# Patient Record
Sex: Male | Born: 1965 | Race: White | Hispanic: No | Marital: Single | Smoking: Never smoker
Health system: Southern US, Community
[De-identification: ages and names within clinical notes are randomized; demographics above are authoritative.]

## PROBLEM LIST (undated history)

## (undated) DIAGNOSIS — I10 Essential (primary) hypertension: Secondary | ICD-10-CM

## (undated) HISTORY — PX: WRIST SURGERY: SHX841

## (undated) HISTORY — PX: LEG SURGERY: SHX1003

---

## 2002-05-16 ENCOUNTER — Emergency Department (HOSPITAL_COMMUNITY): Admission: AD | Admit: 2002-05-16 | Discharge: 2002-05-17 | Payer: Self-pay

## 2013-01-31 ENCOUNTER — Emergency Department (INDEPENDENT_AMBULATORY_CARE_PROVIDER_SITE_OTHER)
Admission: EM | Admit: 2013-01-31 | Discharge: 2013-01-31 | Disposition: A | Discharge status: Home / Self Care | Payer: Managed Care, Other (non HMO) | Source: Home / Self Care | Attending: Family Medicine | Admitting: Family Medicine

## 2013-01-31 ENCOUNTER — Encounter (HOSPITAL_COMMUNITY): Payer: Self-pay | Admitting: Emergency Medicine

## 2013-01-31 DIAGNOSIS — L738 Other specified follicular disorders: Secondary | ICD-10-CM

## 2013-01-31 DIAGNOSIS — L739 Follicular disorder, unspecified: Secondary | ICD-10-CM

## 2013-01-31 DIAGNOSIS — B86 Scabies: Secondary | ICD-10-CM

## 2013-01-31 MED ORDER — DOXYCYCLINE HYCLATE 100 MG PO CAPS: 100.0000 mg | ORAL_CAPSULE | Freq: Two times a day (BID) | ORAL | Status: DC

## 2013-01-31 MED ORDER — PERMETHRIN 5 % EX CREA: TOPICAL_CREAM | CUTANEOUS | Status: DC

## 2013-01-31 NOTE — ED Provider Notes (Signed)
Clifford Mejia is a 48 y.o. male who presents to Urgent Care today for erythematous pruritic papules on the bilateral forearms. This is been present for several weeks and is very itchy at bedtime. He has tried hydrocortisone and Benadryl which have not helped. The rash has spread to his lower extremities and feet. Additionally he notes a 3 cm in diameter oval on his right back that has been present for several days and is mildly tender. No fevers or chills nausea vomiting or diarrhea. No other members of his household with similar rash. He notes that he recently helped a friend move and noted that the apartment was very dirty.    History reviewed. No pertinent past medical history. History  Substance Use Topics  . Smoking status: Never Smoker   . Smokeless tobacco: Not on file  . Alcohol Use: No   ROS as above Medications reviewed. No current facility-administered medications for this encounter.   Current Outpatient Prescriptions  Medication Sig Dispense Refill  . doxycycline (VIBRAMYCIN) 100 MG capsule Take 1 capsule (100 mg total) by mouth 2 (two) times daily.  20 capsule  0  . permethrin (ELIMITE) 5 % cream Apply from the neck down at night and wash off in the morning once  60 g  1    Exam:  BP 144/86  Pulse 67  Temp(Src) 98.7 F (37.1 C) (Oral)  Resp 16  SpO2 100% Gen: Well NAD SKIN:  3 cm diameter oval erythematous indurated tender patch with multiple excoriated papules right back of skin overlying the scapula.  Multiple small erythematous papules most excoriated on bilateral upper extremities.  No mucocutaneous involvement.   Assessment and Plan: 48 y.o. male with scabies.  Plan to treat with permethrin cream as well as house hygiene. Will treat other members of his household as well. Additionally suspect patient has developed folliculitis on his back secondary to  bacterial superinfection of excoriated papules. Treat folliculitis with doxycycline.  Discussed warning  signs or symptoms. Please see discharge instructions. Patient expresses understanding.    Gregor Hams, MD 01/31/13 [phone]

## 2013-01-31 NOTE — ED Notes (Addendum)
C/o rash on back x 2 weeks with itching.  He tried Hydrocortisone and Benadryl cream with partial relief.  Spread to arms, legs, feet. The ones on his back are in a 2-3" oval clump below R clavicle and looked scabbed over, like they have been filled with fluid.  The others are just single small red raised areas. The cream helps for a few hours.

## 2013-01-31 NOTE — Discharge Instructions (Signed)
Thank you for coming in today. Apply the permethrin cream from the neck down at night and wash off in the morning. Use Benadryl at night as needed for itching. Use [place] Itch as needed. Come back if not getting better or worsening.  Take doxycycline twice daily for 8-38 days  Folliculitis  Folliculitis is redness, soreness, and swelling (inflammation) of the hair follicles. This condition can occur anywhere on the body. People with weakened immune systems, diabetes, or obesity have a greater risk of getting folliculitis. CAUSES  Bacterial infection. This is the most common cause.  Fungal infection.  Viral infection.  Contact with certain chemicals, especially oils and tars. Long-term folliculitis can result from bacteria that live in the nostrils. The bacteria may trigger multiple outbreaks of folliculitis over time. SYMPTOMS Folliculitis most commonly occurs on the scalp, thighs, legs, back, buttocks, and areas where hair is shaved frequently. An early sign of folliculitis is a small, white or yellow, pus-filled, itchy lesion (pustule). These lesions appear on a red, inflamed follicle. They are usually less than 0.2 inches (5 mm) wide. When there is an infection of the follicle that goes deeper, it becomes a boil or furuncle. A group of closely packed boils creates a larger lesion (carbuncle). Carbuncles tend to occur in hairy, sweaty areas of the body. DIAGNOSIS  Your caregiver can usually tell what is wrong by doing a physical exam. A sample may be taken from one of the lesions and tested in a lab. This can help determine what is causing your folliculitis. TREATMENT  Treatment may include:  Applying warm compresses to the affected areas.  Taking antibiotic medicines orally or applying them to the skin.  Draining the lesions if they contain a large amount of pus or fluid.  Laser hair removal for cases of long-lasting folliculitis. This helps to prevent regrowth of the  hair. HOME CARE INSTRUCTIONS  Apply warm compresses to the affected areas as directed by your caregiver.  If antibiotics are prescribed, take them as directed. Finish them even if you start to feel better.  You may take over-the-counter medicines to relieve itching.  Do not shave irritated skin.  Follow up with your caregiver as directed. SEEK IMMEDIATE MEDICAL CARE IF:   You have increasing redness, swelling, or pain in the affected area.  You have a fever. MAKE SURE YOU:  Understand these instructions.  Will watch your condition.  Will get help right away if you are not doing well or get worse. Document Released: 03/16/2001 Document Revised: 07/07/2011 Document Reviewed: 04/07/2011 [facility] Patient Information 2014 [place], [place].

## 2013-05-13 ENCOUNTER — Emergency Department (HOSPITAL_COMMUNITY)
Admission: EM | Admit: 2013-05-13 | Discharge: 2013-05-13 | Disposition: A | Discharge status: Home / Self Care | Payer: BC Managed Care – PPO | Source: Home / Self Care | Attending: Family Medicine | Admitting: Family Medicine

## 2013-05-13 ENCOUNTER — Encounter (HOSPITAL_COMMUNITY): Payer: Self-pay | Admitting: Emergency Medicine

## 2013-05-13 DIAGNOSIS — B86 Scabies: Secondary | ICD-10-CM

## 2013-05-13 MED ORDER — HYDROXYZINE HCL 25 MG PO TABS: 25.0000 mg | ORAL_TABLET | Freq: Four times a day (QID) | ORAL | Status: DC | PRN

## 2013-05-13 MED ORDER — PERMETHRIN 5 % EX CREA: TOPICAL_CREAM | CUTANEOUS | Status: DC

## 2013-05-13 NOTE — ED Provider Notes (Signed)
CSN: [id number]     Arrival date & time 05/13/13  0919 History   First MD Initiated Contact with Patient 05/13/13 [phone]     Chief Complaint  Patient presents with  . Rash   (Consider location/radiation/quality/duration/timing/severity/associated sxs/prior Treatment) Patient is a 48 y.o. male presenting with rash. The history is provided by the patient.  Rash Location:  Full body Quality: itchiness   Severity:  Mild Onset quality:  Gradual Duration:  3 months Progression:  Worsening Chronicity:  Recurrent Context: not sick contacts   Context comment:  Seen in dec with similar rash , dx'd as scabies and treatment helped but relapsed. Associated symptoms: no fever     History reviewed. No pertinent past medical history. Past Surgical History  Procedure Laterality Date  . Wrist surgery Left    Family History  Problem Relation Age of Onset  . Hypertension Mother   . Hypertension Father    History  Substance Use Topics  . Smoking status: Never Smoker   . Smokeless tobacco: Not on file  . Alcohol Use: No    Review of Systems  Constitutional: Negative.  Negative for fever.  Gastrointestinal: Negative.   Musculoskeletal: Negative.   Skin: Positive for rash.    Allergies  Review of patient's allergies indicates no known allergies.  Home Medications   Prior to Admission medications   Medication Sig Start Date End Date Taking? Authorizing Provider  doxycycline (VIBRAMYCIN) 100 MG capsule Take 1 capsule (100 mg total) by mouth 2 (two) times daily. 01/31/13   Gregor Hams, MD  permethrin (ELIMITE) 5 % cream Apply from the neck down at night and wash off in the morning once 01/31/13   Gregor Hams, MD   BP 162/82  Pulse 66  Temp(Src) 98.3 F (36.8 C) (Oral)  Resp 17  SpO2 97% Physical Exam  Nursing note and vitals reviewed. Constitutional: He is oriented to person, place, and time. He appears well-developed and well-nourished.  Eyes: Conjunctivae are normal. Pupils are  equal, round, and reactive to light.  Neck: Normal range of motion. Neck supple.  Neurological: He is alert and oriented to person, place, and time.  Skin: Skin is warm and dry. Rash noted.  Nondescript itchy rash on body.    ED Course  Procedures (including critical care time) Labs Review Labs Reviewed - No data to display  Imaging Review No results found.   MDM   1. Scabies        Billy Fischer, MD 05/13/13 1027

## 2013-05-13 NOTE — ED Notes (Signed)
Pt c/o intermittent rash onset 1 month Rash on arms, upper back and legs Rash increases after warm showers Seen here back in 12/14 and dx w/scabies Alert w/no signs of acute distress.

## 2013-07-20 ENCOUNTER — Emergency Department (HOSPITAL_COMMUNITY)
Admission: EM | Admit: 2013-07-20 | Discharge: 2013-07-20 | Disposition: A | Discharge status: Home / Self Care | Payer: BC Managed Care – PPO | Source: Home / Self Care | Attending: Family Medicine | Admitting: Family Medicine

## 2013-07-20 ENCOUNTER — Encounter (HOSPITAL_COMMUNITY): Payer: Self-pay | Admitting: Emergency Medicine

## 2013-07-20 DIAGNOSIS — L299 Pruritus, unspecified: Secondary | ICD-10-CM

## 2013-07-20 NOTE — ED Notes (Signed)
pT  STATES  HE  WAS  TREATED FOR  SCABIES  ABOUT  1  MONTH   AGO     HE  REPORTS  ABOUT 1  WEEK  AGO  HE  DEBELOPED  SOME  ITCHING  -  NO  KNOWN CAUSATIVE  AGENT    NO  ANGIOEDEMA   APPEARING IN NO  ACUTE  DISTRESS

## 2013-07-20 NOTE — ED Provider Notes (Signed)
CSN: [id number]     Arrival date & time 07/20/13  1811 History   First MD Initiated Contact with Patient 07/20/13 1924     Chief Complaint  Patient presents with  . Pruritis   (Consider location/radiation/quality/duration/timing/severity/associated sxs/prior Treatment) Patient is a 48 y.o. male presenting with rash. The history is provided by the patient. No language interpreter was used.  Rash Quality: redness   Severity:  Mild Onset quality:  Gradual Timing:  Constant Progression:  Worsening Chronicity:  New Relieved by:  Nothing Worsened by:  Nothing tried Ineffective treatments:  None tried pt worried about scabies.  Pt had a month ago.  Pt has itching to his back Hands and arms not involved History reviewed. No pertinent past medical history. Past Surgical History  Procedure Laterality Date  . Wrist surgery Left    Family History  Problem Relation Age of Onset  . Hypertension Mother   . Hypertension Father    History  Substance Use Topics  . Smoking status: Never Smoker   . Smokeless tobacco: Not on file  . Alcohol Use: No    Review of Systems  Skin: Positive for rash.  All other systems reviewed and are negative.   Allergies  Review of patient's allergies indicates no known allergies.  Home Medications   Prior to Admission medications   Medication Sig Start Date End Date Taking? Authorizing Provider  doxycycline (VIBRAMYCIN) 100 MG capsule Take 1 capsule (100 mg total) by mouth 2 (two) times daily. 01/31/13   Gregor Hams, MD  hydrOXYzine (ATARAX/VISTARIL) 25 MG tablet Take 1 tablet (25 mg total) by mouth every 6 (six) hours as needed for itching. 05/13/13   Billy Fischer, MD  permethrin (ELIMITE) 5 % cream Apply from the neck down at night and wash off in the morning once 01/31/13   Gregor Hams, MD  permethrin (ELIMITE) 5 % cream Use as instructed on package 05/13/13   Billy Fischer, MD   BP 149/94  Pulse 69  Temp(Src) 97.5 F (36.4 C) (Oral)  Resp 18   SpO2 96% Physical Exam  Nursing note and vitals reviewed. Constitutional: He is oriented to person, place, and time. He appears well-developed and well-nourished.  HENT:  Head: Normocephalic.  Eyes: EOM are normal.  Neck: Normal range of motion.  Pulmonary/Chest: Effort normal.  Musculoskeletal: Normal range of motion.  Neurological: He is alert and oriented to person, place, and time.  Skin: No rash noted.  Psychiatric: He has a normal mood and affect.    ED Course  Procedures (including critical care time) Labs Review Labs Reviewed - No data to display  Imaging Review No results found.   MDM   1. Pruritus    Pt advised hydrocortisone, benadryl to ic=tchy areas,  No sign of scabies    Fransico Meadow, [place] 07/20/13 1943

## 2013-07-20 NOTE — Discharge Instructions (Signed)
Pruritus  Pruritis is an itch. There are many different problems that can cause an itch. Dry skin is one of the most common causes of itching. Most cases of itching do not require medical attention.  HOME CARE INSTRUCTIONS  Make sure your skin is moistened on a regular basis. A moisturizer that contains petroleum jelly is best for keeping moisture in your skin. If you develop a rash, you may try the following for relief:   Use corticosteroid cream.  Apply cool compresses to the affected areas.  Bathe with Epsom salts or baking soda in the bathwater.  Soak in colloidal oatmeal baths. These are available at your pharmacy.  Apply baking soda paste to the rash. Stir water into baking soda until it reaches a paste-like consistency.  Use an anti-itch lotion.  Take over-the-counter diphenhydramine medicine by mouth as the instructions direct.  Avoid scratching. Scratching may cause the rash to become infected. If itching is very bad, your caregiver may suggest prescription lotions or creams to lessen your symptoms.  Avoid hot showers, which can make itching worse. A cold shower may help with itching as long as you use a moisturizer after the shower. SEEK MEDICAL CARE IF: The itching does not go away after several days. Document Released: 09/17/2010 Document Revised: 03/30/2011 Document Reviewed: 09/17/2010 [facility] Patient Information 2015 [place], [place]. This information is not intended to replace advice given to you by your health care provider. Make sure you discuss any questions you have with your health care provider. Benadryl for itching.  Use a good moisturizer,  Hydrocortisone ointment to areas of itching

## 2013-07-21 NOTE — ED Provider Notes (Signed)
Medical screening examination/treatment/procedure(s) were performed by a resident physician or non-physician practitioner and as the supervising physician I was immediately available for consultation/collaboration.  Lynne Leader, MD    Gregor Hams, MD 07/21/13 [phone]

## 2015-06-27 ENCOUNTER — Ambulatory Visit (HOSPITAL_COMMUNITY)
Admission: EM | Admit: 2015-06-27 | Discharge: 2015-06-27 | Disposition: A | Discharge status: Home / Self Care | Payer: BLUE CROSS/BLUE SHIELD | Attending: Emergency Medicine | Admitting: Emergency Medicine

## 2015-06-27 ENCOUNTER — Encounter (HOSPITAL_COMMUNITY): Payer: Self-pay | Admitting: Emergency Medicine

## 2015-06-27 DIAGNOSIS — L209 Atopic dermatitis, unspecified: Secondary | ICD-10-CM | POA: Diagnosis not present

## 2015-06-27 DIAGNOSIS — Z8249 Family history of ischemic heart disease and other diseases of the circulatory system: Secondary | ICD-10-CM | POA: Diagnosis not present

## 2015-06-27 DIAGNOSIS — Z202 Contact with and (suspected) exposure to infections with a predominantly sexual mode of transmission: Secondary | ICD-10-CM

## 2015-06-27 MED ORDER — HYDROCORTISONE 1 % EX CREA: TOPICAL_CREAM | CUTANEOUS | Status: DC

## 2015-06-27 MED ORDER — METRONIDAZOLE 500 MG PO TABS: 500.0000 mg | ORAL_TABLET | Freq: Two times a day (BID) | ORAL | Status: DC

## 2015-06-27 NOTE — ED Notes (Signed)
Patient cannot void at this time. Given water to drink.

## 2015-06-27 NOTE — ED Notes (Signed)
Patient triaged by provider. Please see note.

## 2015-06-27 NOTE — ED Provider Notes (Signed)
CSN: [id number]     Arrival date & time 06/27/15  1756 History   First MD Initiated Contact with Patient 06/27/15 1813     Chief Complaint  Patient presents with  . Exposure to STD  . Foot Pain   (Consider location/radiation/quality/duration/timing/severity/associated sxs/prior Treatment) HPI History obtained from patient:  Pt presents with the cc of:  2 complaints complaint #1 rash on his right foot complaint #2 STD exposure Duration of symptoms: Rash symptoms have been present for several months STD exposure over the last few days Treatment prior to arrival: Rash on the foot he has used antifungal cream. Context: Foot rash he states he has noted for several months now and is not getting better with any of the treatments that he is doing at home. Other symptoms include: STD exposure states that his girlfriend said that she was seen by her PCP the other day and was diagnosed with Trichomonas. She advised him that he needed to be seen and treated. Pain score: 0 FAMILY HISTORY: Hypertension in mother and father    History reviewed. No pertinent past medical history. Past Surgical History  Procedure Laterality Date  . Wrist surgery Left    Family History  Problem Relation Age of Onset  . Hypertension Mother   . Hypertension Father    Social History  Substance Use Topics  . Smoking status: Never Smoker   . Smokeless tobacco: None  . Alcohol Use: No    Review of Systems  Denies: HEADACHE, NAUSEA, ABDOMINAL PAIN, CHEST PAIN, CONGESTION, DYSURIA, SHORTNESS OF BREATH  Allergies  Review of patient's allergies indicates no known allergies.  Home Medications   Prior to Admission medications   Medication Sig Start Date End Date Taking? Authorizing Provider  doxycycline (VIBRAMYCIN) 100 MG capsule Take 1 capsule (100 mg total) by mouth 2 (two) times daily. 01/31/13   Gregor Hams, MD  hydrocortisone cream 1 % Apply to affected area 2 times daily 06/27/15   Konrad Felix, PA   hydrOXYzine (ATARAX/VISTARIL) 25 MG tablet Take 1 tablet (25 mg total) by mouth every 6 (six) hours as needed for itching. 05/13/13   Billy Fischer, MD  metroNIDAZOLE (FLAGYL) 500 MG tablet Take 1 tablet (500 mg total) by mouth 2 (two) times daily. 06/27/15   Konrad Felix, PA  permethrin (ELIMITE) 5 % cream Apply from the neck down at night and wash off in the morning once 01/31/13   Gregor Hams, MD  permethrin (ELIMITE) 5 % cream Use as instructed on package 05/13/13   Billy Fischer, MD   Meds Ordered and Administered this Visit  Medications - No data to display  BP 152/93 mmHg  Pulse 73  Temp(Src) 98.9 F (37.2 C) (Oral)  Resp 16  SpO2 97% No data found.   Physical Exam NURSES NOTES AND VITAL SIGNS REVIEWED. CONSTITUTIONAL: Well developed, well nourished, no acute distress HEENT: normocephalic, atraumatic EYES: Conjunctiva normal NECK:normal ROM, supple, no adenopathy PULMONARY:No respiratory distress, normal effort ABDOMINAL: Soft, ND, NT BS+, No CVAT MUSCULOSKELETAL: Normal ROM of all extremities, right foot. 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 Similar but smaller area on the left foot. Right foot measures approximately 3 cm dry scaly rash. SKIN: warm and dry without rash PSYCHIATRIC: Mood and affect, behavior are normal  ED Course  Procedures (including critical care time)  Labs Review Labs Reviewed  URINE CYTOLOGY ANCILLARY ONLY    Imaging Review No results found.   Visual  Acuity Review  Right Eye Distance:   Left Eye Distance:   Bilateral Distance:    Right Eye Near:   Left Eye Near:    Bilateral Near:     Cortisone cream for atopic dermatitis and Flagyl for Trichomonas cultures are pending at this time.  STD exposure will be full recovery he topics dermatitis will most likely be an ongoing situation that will have waxing and waning but will not leave any long-term disability sequelae.  MDM   1. Atopic dermatitis    2. STD exposure     Patient is reassured that there are no issues that require transfer to higher level of care at this time or additional tests. Patient is advised to continue home symptomatic treatment. Patient is advised that if there are new or worsening symptoms to attend the emergency department, contact primary care provider, or return to UC. Instructions of care provided discharged home in stable condition.    THIS NOTE WAS GENERATED USING A VOICE RECOGNITION SOFTWARE PROGRAM. ALL REASONABLE EFFORTS  WERE MADE TO PROOFREAD THIS DOCUMENT FOR ACCURACY.  I have verbally reviewed the discharge instructions with the patient. A printed AVS was given to the patient.  All questions were answered prior to discharge.      Konrad Felix, PA 06/27/15 2031

## 2015-06-27 NOTE — Discharge Instructions (Signed)
Safe Sex Safe sex is about reducing the risk of giving or getting a sexually transmitted disease (STD). STDs are spread through sexual contact involving the genitals, mouth, or rectum. Some STDs can be cured and others cannot. Safe sex can also prevent unintended pregnancies.  WHAT ARE SOME SAFE SEX PRACTICES?  Limit your sexual activity to only one partner who is having sex with only you.  Talk to your partner about his or her past partners, past STDs, and drug use.  Use a condom every time you have sexual intercourse. This includes vaginal, oral, and anal sexual activity. Both females and males should wear condoms during oral sex. Only use latex or polyurethane condoms and water-based lubricants. Using petroleum-based lubricants or oils to lubricate a condom will weaken the condom and increase the chance that it will break. The condom should be in place from the beginning to the end of sexual activity. Wearing a condom reduces, but does not completely eliminate, your risk of getting or giving an STD. STDs can be spread by contact with infected body fluids and skin.  Get vaccinated for hepatitis B and HPV.  Avoid alcohol and recreational drugs, which can affect your judgment. You may forget to use a condom or participate in high-risk sex.  For females, avoid douching after sexual intercourse. Douching can spread an infection farther into the reproductive tract.  Check your body for signs of sores, blisters, rashes, or unusual discharge. See your health care provider if you notice any of these signs.  Avoid sexual contact if you have symptoms of an infection or are being treated for an STD. If you or your partner has herpes, avoid sexual contact when blisters are present. Use condoms at all other times.  If you are at risk of being infected with HIV, it is recommended that you take a prescription medicine daily to prevent HIV infection. This is called pre-exposure prophylaxis (PrEP). You are  considered at risk if:  You are a man who has sex with other men (MSM).  You are a heterosexual man or woman who is sexually active with more than one partner.  You take drugs by injection.  You are sexually active with a partner who has HIV.  Talk with your health care provider about whether you are at high risk of being infected with HIV. If you choose to begin PrEP, you should first be tested for HIV. You should then be tested every 3 months for as long as you are taking PrEP.  See your health care provider for regular screenings, exams, and tests for other STDs. Before having sex with a new partner, each of you should be screened for STDs and should talk about the results with each other. WHAT ARE THE BENEFITS OF SAFE SEX?   There is less chance of getting or giving an STD.  You can prevent unwanted or unintended pregnancies.  By discussing safe sex concerns with your partner, you may increase feelings of intimacy, comfort, trust, and honesty between the two of you.   This information is not intended to replace advice given to you by your health care provider. Make sure you discuss any questions you have with your health care provider.   Document Released: 02/13/2004 Document Revised: 01/26/2014 Document Reviewed: 06/29/2011 Elsevier Interactive Patient Education 2016 [employer]. Trichomoniasis Trichomoniasis is an infection caused by an organism called Trichomonas. The infection can affect both women and men. In women, the outer male genitalia and the vagina are affected. In  men, the penis is mainly affected, but the prostate and other reproductive organs can also be involved. Trichomoniasis is a sexually transmitted infection (STI) and is most often passed to another person through sexual contact.  RISK FACTORS  Having unprotected sexual intercourse.  Having sexual intercourse with an infected partner. SIGNS AND SYMPTOMS  Symptoms of trichomoniasis in women  include:  Abnormal gray-green frothy vaginal discharge.  Itching and irritation of the vagina.  Itching and irritation of the area outside the vagina. Symptoms of trichomoniasis in men include:   Penile discharge with or without pain.  Pain during urination. This results from inflammation of the urethra. DIAGNOSIS  Trichomoniasis may be found during a Pap test or physical exam. Your health care provider may use one of the following methods to help diagnose this infection:  Testing the pH of the vagina with a test tape.  Using a vaginal swab test that checks for the Trichomonas organism. A test is available that provides results within a few minutes.  Examining a urine sample.  Testing vaginal secretions. Your health care provider may test you for other STIs, including HIV. TREATMENT   You may be given medicine to fight the infection. Women should inform their health care provider if they could be or are pregnant. Some medicines used to treat the infection should not be taken during pregnancy.  Your health care provider may recommend over-the-counter medicines or creams to decrease itching or irritation.  Your sexual partner will need to be treated if infected.  Your health care provider may test you for infection again 3 months after treatment. HOME CARE INSTRUCTIONS   Take medicines only as directed by your health care provider.  Take over-the-counter medicine for itching or irritation as directed by your health care provider.  Do not have sexual intercourse while you have the infection.  Women should not douche or wear tampons while they have the infection.  Discuss your infection with your partner. Your partner may have gotten the infection from you, or you may have gotten it from your partner.  Have your sex partner get examined and treated if necessary.  Practice safe, informed, and protected sex.  See your health care provider for other STI testing. SEEK MEDICAL  CARE IF:   You still have symptoms after you finish your medicine.  You develop abdominal pain.  You have pain when you urinate.  You have bleeding after sexual intercourse.  You develop a rash.  Your medicine makes you sick or makes you throw up (vomit). MAKE SURE YOU:  Understand these instructions.  Will watch your condition.  Will get help right away if you are not doing well or get worse.   This information is not intended to replace advice given to you by your health care provider. Make sure you discuss any questions you have with your health care provider.   Document Released: 07/01/2000 Document Revised: 01/26/2014 Document Reviewed: 10/17/2012 Elsevier Interactive Patient Education 2016 [employer] Eczema Eczema, also called atopic dermatitis, is a skin disorder that causes inflammation of the skin. It causes a red rash and dry, scaly skin. The skin becomes very itchy. Eczema is generally worse during the cooler winter months and often improves with the warmth of summer. Eczema usually starts showing signs in infancy. Some children outgrow eczema, but it may last through adulthood.  CAUSES  The exact cause of eczema is not known, but it appears to run in families. People with eczema often have a family history  of eczema, allergies, asthma, or hay fever. Eczema is not contagious. Flare-ups of the condition may be caused by:   Contact with something you are sensitive or allergic to.   Stress. SIGNS AND SYMPTOMS  Dry, scaly skin.   Red, itchy rash.   Itchiness. This may occur before the skin rash and may be very intense.  DIAGNOSIS  The diagnosis of eczema is usually made based on symptoms and medical history. TREATMENT  Eczema cannot be cured, but symptoms usually can be controlled with treatment and other strategies. A treatment plan might include:  Controlling the itching and scratching.   Use over-the-counter antihistamines as directed for itching.  This is especially useful at night when the itching tends to be worse.   Use over-the-counter steroid creams as directed for itching.   Avoid scratching. Scratching makes the rash and itching worse. It may also result in a skin infection (impetigo) due to a break in the skin caused by scratching.   Keeping the skin well moisturized with creams every day. This will seal in moisture and help prevent dryness. Lotions that contain alcohol and water should be avoided because they can dry the skin.   Limiting exposure to things that you are sensitive or allergic to (allergens).   Recognizing situations that cause stress.   Developing a plan to manage stress.  HOME CARE INSTRUCTIONS   Only take over-the-counter or prescription medicines as directed by your health care provider.   Do not use anything on the skin without checking with your health care provider.   Keep baths or showers short (5 minutes) in warm (not hot) water. Use mild cleansers for bathing. These should be unscented. You may add nonperfumed bath oil to the bath water. It is best to avoid soap and bubble bath.   Immediately after a bath or shower, when the skin is still damp, apply a moisturizing ointment to the entire body. This ointment should be a petroleum ointment. This will seal in moisture and help prevent dryness. The thicker the ointment, the better. These should be unscented.   Keep fingernails cut short. Children with eczema may need to wear soft gloves or mittens at night after applying an ointment.   Dress in clothes made of cotton or cotton blends. Dress lightly, because heat increases itching.   A child with eczema should stay away from anyone with fever blisters or cold sores. The virus that causes fever blisters (herpes simplex) can cause a serious skin infection in children with eczema. SEEK MEDICAL CARE IF:   Your itching interferes with sleep.   Your rash gets worse or is not better within 1  week after starting treatment.   You see pus or soft yellow scabs in the rash area.   You have a fever.   You have a rash flare-up after contact with someone who has fever blisters.    This information is not intended to replace advice given to you by your health care provider. Make sure you discuss any questions you have with your health care provider.   Document Released: 01/03/2000 Document Revised: 10/26/2012 Document Reviewed: 08/08/2012 Elsevier Interactive Patient Education [employer].

## 2015-06-28 LAB — URINE CYTOLOGY ANCILLARY ONLY
CHLAMYDIA, DNA PROBE: NEGATIVE
Neisseria Gonorrhea: NEGATIVE
Trichomonas: POSITIVE — AB

## 2019-01-14 ENCOUNTER — Emergency Department (HOSPITAL_COMMUNITY): Payer: PRIVATE HEALTH INSURANCE

## 2019-01-14 ENCOUNTER — Emergency Department (HOSPITAL_COMMUNITY)
Admission: EM | Admit: 2019-01-14 | Discharge: 2019-01-14 | Disposition: A | Discharge status: Home / Self Care | Payer: PRIVATE HEALTH INSURANCE | Attending: Emergency Medicine | Admitting: Emergency Medicine

## 2019-01-14 ENCOUNTER — Encounter (HOSPITAL_COMMUNITY): Payer: Self-pay | Admitting: *Deleted

## 2019-01-14 ENCOUNTER — Encounter (HOSPITAL_COMMUNITY): Payer: Self-pay | Admitting: Emergency Medicine

## 2019-01-14 ENCOUNTER — Other Ambulatory Visit: Payer: Self-pay

## 2019-01-14 ENCOUNTER — Ambulatory Visit (INDEPENDENT_AMBULATORY_CARE_PROVIDER_SITE_OTHER)
Admission: EM | Admit: 2019-01-14 | Discharge: 2019-01-14 | Disposition: A | Discharge status: Other Acute Inpatient Hospital | Payer: PRIVATE HEALTH INSURANCE | Source: Home / Self Care

## 2019-01-14 DIAGNOSIS — R112 Nausea with vomiting, unspecified: Secondary | ICD-10-CM | POA: Diagnosis not present

## 2019-01-14 DIAGNOSIS — Z79899 Other long term (current) drug therapy: Secondary | ICD-10-CM | POA: Diagnosis not present

## 2019-01-14 DIAGNOSIS — I10 Essential (primary) hypertension: Secondary | ICD-10-CM | POA: Diagnosis not present

## 2019-01-14 DIAGNOSIS — R1013 Epigastric pain: Secondary | ICD-10-CM | POA: Insufficient documentation

## 2019-01-14 DIAGNOSIS — R1010 Upper abdominal pain, unspecified: Secondary | ICD-10-CM | POA: Diagnosis present

## 2019-01-14 HISTORY — DX: Essential (primary) hypertension: I10

## 2019-01-14 LAB — COMPREHENSIVE METABOLIC PANEL
ALT: 72 U/L — ABNORMAL HIGH (ref 0–44)
AST: 45 U/L — ABNORMAL HIGH (ref 15–41)
Albumin: 4.5 g/dL (ref 3.5–5.0)
Alkaline Phosphatase: 68 U/L (ref 38–126)
Anion gap: 11 (ref 5–15)
BUN: 16 mg/dL (ref 6–20)
CO2: 22 mmol/L (ref 22–32)
Calcium: 9.9 mg/dL (ref 8.9–10.3)
Chloride: 105 mmol/L (ref 98–111)
Creatinine, Ser: 1.04 mg/dL (ref 0.61–1.24)
GFR calc Af Amer: >60 mL/min (ref >60)
GFR calc non Af Amer: >60 mL/min (ref >60)
Glucose, Bld: 133 mg/dL — ABNORMAL HIGH (ref 70–99)
Potassium: 4.1 mmol/L (ref 3.5–5.1)
Sodium: 138 mmol/L (ref 135–145)
Total Bilirubin: 0.5 mg/dL (ref 0.3–1.2)
Total Protein: 7.9 g/dL (ref 6.5–8.1)

## 2019-01-14 LAB — URINALYSIS, ROUTINE W REFLEX MICROSCOPIC
Bilirubin Urine: NEGATIVE
Glucose, UA: NEGATIVE mg/dL
Hgb urine dipstick: NEGATIVE
Ketones, ur: NEGATIVE mg/dL
Leukocytes,Ua: NEGATIVE
Nitrite: NEGATIVE
Protein, ur: NEGATIVE mg/dL
Specific Gravity, Urine: 1.02 (ref 1.005–1.030)
pH: 5 (ref 5.0–8.0)

## 2019-01-14 LAB — CBC
HCT: 45.9 % (ref 39.0–52.0)
Hemoglobin: 14.4 g/dL (ref 13.0–17.0)
MCH: 26.6 pg (ref 26.0–34.0)
MCHC: 31.4 g/dL (ref 30.0–36.0)
MCV: 84.7 fL (ref 80.0–100.0)
Platelets: 272 10*3/uL (ref 150–400)
RBC: 5.42 MIL/uL (ref 4.22–5.81)
RDW: 14 % (ref 11.5–15.5)
WBC: 10.5 10*3/uL (ref 4.0–10.5)
nRBC: 0 % (ref 0.0–0.2)

## 2019-01-14 LAB — LIPASE, BLOOD: Lipase: 25 U/L (ref 11–51)

## 2019-01-14 MED ORDER — MORPHINE SULFATE (PF) 4 MG/ML IV SOLN
4.0000 mg | Freq: Once | INTRAVENOUS | Status: AC
  Administered 2019-01-14: 20:00:00 4 mg via INTRAVENOUS
  Filled 2019-01-14: qty 1

## 2019-01-14 MED ORDER — ONDANSETRON 4 MG PO TBDP
ORAL_TABLET | ORAL | Status: AC
  Filled 2019-01-14: qty 1

## 2019-01-14 MED ORDER — ALUM & MAG HYDROXIDE-SIMETH 200-200-20 MG/5ML PO SUSP
30.0000 mL | Freq: Once | ORAL | Status: AC
  Administered 2019-01-14: 20:00:00 30 mL via ORAL
  Filled 2019-01-14: qty 30

## 2019-01-14 MED ORDER — ONDANSETRON HCL 4 MG/2ML IJ SOLN
4.0000 mg | Freq: Once | INTRAMUSCULAR | Status: AC
  Administered 2019-01-14: 17:00:00 4 mg via INTRAVENOUS
  Filled 2019-01-14: qty 2

## 2019-01-14 MED ORDER — OXYCODONE HCL 5 MG PO TABS: 5.0000 mg | ORAL_TABLET | ORAL | 0 refills | Status: DC | PRN

## 2019-01-14 MED ORDER — LIDOCAINE VISCOUS HCL 2 % MT SOLN: 15.0000 mL | Freq: Once | OROMUCOSAL | Status: DC

## 2019-01-14 MED ORDER — OMEPRAZOLE 20 MG PO CPDR: 20.0000 mg | DELAYED_RELEASE_CAPSULE | Freq: Every day | ORAL | 0 refills | Status: DC

## 2019-01-14 MED ORDER — SODIUM CHLORIDE 0.9% FLUSH
3.0000 mL | Freq: Once | INTRAVENOUS | Status: AC
  Administered 2019-01-14: 20:00:00 3 mL via INTRAVENOUS

## 2019-01-14 MED ORDER — IOHEXOL 300 MG/ML  SOLN
100.0000 mL | Freq: Once | INTRAMUSCULAR | Status: AC | PRN
  Administered 2019-01-14: 18:00:00 100 mL via INTRAVENOUS

## 2019-01-14 MED ORDER — ONDANSETRON 4 MG PO TBDP
4.0000 mg | ORAL_TABLET | Freq: Once | ORAL | Status: AC
  Administered 2019-01-14: 12:00:00 4 mg via ORAL

## 2019-01-14 MED ORDER — ALUM & MAG HYDROXIDE-SIMETH 200-200-20 MG/5ML PO SUSP: 30.0000 mL | Freq: Once | ORAL | Status: DC

## 2019-01-14 MED ORDER — MORPHINE SULFATE (PF) 4 MG/ML IV SOLN
4.0000 mg | Freq: Once | INTRAVENOUS | Status: AC
  Administered 2019-01-14: 17:00:00 4 mg via INTRAVENOUS
  Filled 2019-01-14: qty 1

## 2019-01-14 MED ORDER — SODIUM CHLORIDE 0.9 % IV BOLUS
1000.0000 mL | Freq: Once | INTRAVENOUS | Status: AC
  Administered 2019-01-14: 17:00:00 1000 mL via INTRAVENOUS

## 2019-01-14 NOTE — ED Notes (Signed)
Pt st's pain is better after pain med.  Continues to have slight pain in upper abd.

## 2019-01-14 NOTE — ED Provider Notes (Signed)
[place]    CSN: [id number] Arrival date & time: 01/14/19  1020      History   Chief Complaint Chief Complaint  Patient presents with  . Abdominal Pain    HPI Clifford Mejia is a 53 y.o. male history of hypertension, presenting today for evaluation of epigastric abdominal pain.  Patient states that this morning he woke up around 2 AM with a constant pain in his upper abdomen.  Has slightly moved lower since onset.  Has had approximately 5-6 episodes of vomiting.  Denies hematochezia.  Denies diarrhea or change in bowel movements.  Has not noticed any blood in stool.  Denies chest pain or shortness of breath.  Notices pain with all positions, no specific position alleviates pain.  Denies history of diabetes, denies tobacco use.  Denies alcohol use.  Denies significant use of NSAIDs.  HPI  Past Medical History:  Diagnosis Date  . Hypertension    no meds prescribed    There are no problems to display for this patient.   Past Surgical History:  Procedure Laterality Date  . LEG SURGERY     "laser"  . WRIST SURGERY Left        Home Medications    Prior to Admission medications   Medication Sig Start Date End Date Taking? Authorizing Provider  doxycycline (VIBRAMYCIN) 100 MG capsule Take 1 capsule (100 mg total) by mouth 2 (two) times daily. 01/31/13   Gregor Hams, MD  hydrocortisone cream 1 % Apply to affected area 2 times daily 06/27/15   Konrad Felix, PA  hydrOXYzine (ATARAX/VISTARIL) 25 MG tablet Take 1 tablet (25 mg total) by mouth every 6 (six) hours as needed for itching. 05/13/13   Billy Fischer, MD  metroNIDAZOLE (FLAGYL) 500 MG tablet Take 1 tablet (500 mg total) by mouth 2 (two) times daily. 06/27/15   Konrad Felix, PA  permethrin (ELIMITE) 5 % cream Apply from the neck down at night and wash off in the morning once 01/31/13   Gregor Hams, MD  permethrin Nancy Fetter) 5 % cream Use as instructed on package 05/13/13   Billy Fischer, MD     Family History Family History  Problem Relation Age of Onset  . Hypertension Mother   . Hypertension Father     Social History Social History   Tobacco Use  . Smoking status: Never Smoker  . Smokeless tobacco: Never Used  Substance Use Topics  . Alcohol use: Yes    Comment: rarely  . Drug use: No     Allergies   Patient has no known allergies.   Review of Systems Review of Systems  Constitutional: Negative for activity change, appetite change, chills, fatigue and fever.  HENT: Negative for congestion, ear pain, rhinorrhea, sinus pressure, sore throat and trouble swallowing.   Eyes: Negative for discharge and redness.  Respiratory: Negative for cough, chest tightness and shortness of breath.   Cardiovascular: Negative for chest pain.  Gastrointestinal: Positive for abdominal pain, nausea and vomiting. Negative for diarrhea.  Musculoskeletal: Negative for myalgias.  Skin: Negative for rash.  Neurological: Negative for dizziness, light-headedness and headaches.     Physical Exam Triage Vital Signs ED Triage Vitals  Enc Vitals Group     BP 01/14/19 1106 (!) 166/103     Pulse Rate 01/14/19 1106 83     Resp 01/14/19 1106 18     Temp 01/14/19 1106 98.3 F (36.8 C)     Temp Source 01/14/19  1106 Oral     SpO2 01/14/19 1106 100 %     Weight --      Height --      Head Circumference --      Peak Flow --      Pain Score 01/14/19 1107 9     Pain Loc --      Pain Edu? --      Excl. in [place]? --    No data found.  Updated Vital Signs BP (!) 166/103   Pulse 83   Temp 98.3 F (36.8 C) (Oral)   Resp 18   SpO2 100%   Visual Acuity Right Eye Distance:   Left Eye Distance:   Bilateral Distance:    Right Eye Near:   Left Eye Near:    Bilateral Near:     Physical Exam Vitals and nursing note reviewed.  Constitutional:      Appearance: He is well-developed.     Comments: Appears uncomfortable, leaning forward for comfort  HENT:     Head: Normocephalic and  atraumatic.  Eyes:     Conjunctiva/sclera: Conjunctivae normal.  Cardiovascular:     Rate and Rhythm: Normal rate and regular rhythm.     Heart sounds: No murmur.  Pulmonary:     Effort: Pulmonary effort is normal. No respiratory distress.     Breath sounds: Normal breath sounds.     Comments: Breathing comfortably at rest, CTABL, no wheezing, rales or other adventitious sounds auscultated Abdominal:     Palpations: Abdomen is soft.     Tenderness: There is abdominal tenderness.  Musculoskeletal:     Cervical back: Neck supple.  Skin:    General: Skin is warm and dry.  Neurological:     Mental Status: He is alert.      UC Treatments / Results  Labs (all labs ordered are listed, but only abnormal results are displayed) Labs Reviewed - No data to display  EKG   Radiology No results found.  Procedures Procedures (including critical care time)  Medications Ordered in UC Medications  ondansetron (ZOFRAN-ODT) disintegrating tablet 4 mg (has no administration in time range)    Initial Impression / Assessment and Plan / UC Course  I have reviewed the triage vital signs and the nursing notes.  Pertinent labs & imaging results that were available during my care of the patient were reviewed by me and considered in my medical decision making (see chart for details).     EKG with nonspecific T wave inversions in 2 3, aVF as well as V5 and V6, given acute onset of abdominal pain earlier this morning, EKG abnormalities recommending further evaluation and treatment in the emergency room.  Patient verbalized understanding.  Zofran provided prior to discharge for nausea and sent with family member.    Final Clinical Impressions(s) / UC Diagnoses   Final diagnoses:  None   Discharge Instructions   None    ED Prescriptions    None     PDMP not reviewed this encounter.   Janith Lima, PA-C 01/14/19 1153

## 2019-01-14 NOTE — Discharge Instructions (Addendum)
Please follow up with a primary doctor or GI doctor - which ever you can get in with first Avoid Tylenol, Ibuprofen, or alcohol Take Oxycodone (pain medicine) as needed for severe pain Take Omeprazole (acid reducer) daily for stomach pain

## 2019-01-14 NOTE — Discharge Instructions (Signed)
Sent to ED for further work up

## 2019-01-14 NOTE — ED Provider Notes (Signed)
[facility] EMERGENCY DEPARTMENT Provider Note   CSN: [id number] Arrival date & time: 01/14/19  1217     History Chief Complaint  Patient presents with  . Abdominal Pain    Clifford Mejia is a 53 y.o. male who presents with abdominal pain, nausea and vomiting.  Patient states that he woke up this morning at approximately 2 AM with acute upper abdominal pain.  Seems to be over the middle of his abdomen but also radiates to the left and right and to the back.  He reports associated nausea with approximately 6 episodes of emesis.  Has not seen any blood in the emesis.  He states that he thought it might be acid reflux so he took acid reflux medicine but it did not not improve his symptoms.  He went to urgent care and they did an EKG and was told that he needed to go to the ED for further evaluation.  Patient denies any chest pain or shortness of breath to me.  He denies fever, chills, diarrhea, constipation, urinary symptoms.  He denies prior abdominal surgeries.  He is not taking any excessive amounts of NSAIDs or Tylenol.  He drank 1 beer yesterday.  He has never had an endoscopy or colonoscopy. He denies hx of pancreatitis, PUD, gastritis. No hx of pain with eating.  HPI     Past Medical History:  Diagnosis Date  . Hypertension    no meds prescribed    There are no problems to display for this patient.   Past Surgical History:  Procedure Laterality Date  . LEG SURGERY     "laser"  . WRIST SURGERY Left        Family History  Problem Relation Age of Onset  . Hypertension Mother   . Hypertension Father     Social History   Tobacco Use  . Smoking status: Never Smoker  . Smokeless tobacco: Never Used  Substance Use Topics  . Alcohol use: Yes    Comment: rarely  . Drug use: No    Home Medications Prior to Admission medications   Medication Sig Start Date End Date Taking? Authorizing Provider  doxycycline (VIBRAMYCIN) 100 MG capsule Take 1  capsule (100 mg total) by mouth 2 (two) times daily. 01/31/13   Gregor Hams, MD  hydrocortisone cream 1 % Apply to affected area 2 times daily 06/27/15   Konrad Felix, PA  hydrOXYzine (ATARAX/VISTARIL) 25 MG tablet Take 1 tablet (25 mg total) by mouth every 6 (six) hours as needed for itching. 05/13/13   Billy Fischer, MD  metroNIDAZOLE (FLAGYL) 500 MG tablet Take 1 tablet (500 mg total) by mouth 2 (two) times daily. 06/27/15   Konrad Felix, PA  permethrin (ELIMITE) 5 % cream Apply from the neck down at night and wash off in the morning once 01/31/13   Gregor Hams, MD  permethrin Nancy Fetter) 5 % cream Use as instructed on package 05/13/13   Billy Fischer, MD    Allergies    Patient has no known allergies.  Review of Systems   Review of Systems  Constitutional: Negative for chills and fever.  Respiratory: Negative for shortness of breath.   Cardiovascular: Negative for chest pain.  Gastrointestinal: Positive for abdominal pain, nausea and vomiting. Negative for constipation and diarrhea.  Genitourinary: Negative for difficulty urinating, dysuria, flank pain and frequency.  All other systems reviewed and are negative.   Physical Exam Updated Vital Signs BP (!) 165/91 (BP  Location: Right Arm)   Pulse 87   Temp 98.4 F (36.9 C) (Oral)   Resp 18   SpO2 99%   Physical Exam Vitals and nursing note reviewed.  Constitutional:      General: He is not in acute distress.    Appearance: He is well-developed. He is not ill-appearing.     Comments: Cooperative.  Uncomfortable appearing lying fetal position on the stretcher  HENT:     Head: Normocephalic and atraumatic.  Eyes:     General: No scleral icterus.       Right eye: No discharge.        Left eye: No discharge.     Conjunctiva/sclera: Conjunctivae normal.     Pupils: Pupils are equal, round, and reactive to light.  Cardiovascular:     Rate and Rhythm: Normal rate and regular rhythm.  Pulmonary:     Effort: Pulmonary effort  is normal. No respiratory distress.     Breath sounds: Normal breath sounds.  Abdominal:     General: There is no distension.     Palpations: Abdomen is soft.     Tenderness: There is abdominal tenderness (diffuse upper abdominal tenderness, worse in the epigastric area).  Musculoskeletal:     Cervical back: Normal range of motion.  Skin:    General: Skin is warm and dry.  Neurological:     Mental Status: He is alert and oriented to person, place, and time.  Psychiatric:        Behavior: Behavior normal.     ED Results / Procedures / Treatments   Labs (all labs ordered are listed, but only abnormal results are displayed) Labs Reviewed  COMPREHENSIVE METABOLIC PANEL - Abnormal; Notable for the following components:      Result Value   Glucose, Bld 133 (*)    AST 45 (*)    ALT 72 (*)    All other components within normal limits  LIPASE, BLOOD  CBC  URINALYSIS, ROUTINE W REFLEX MICROSCOPIC    EKG EKG Interpretation  Date/Time:  Saturday January 14 2019 12:52:10 EST Ventricular Rate:  76 PR Interval:  178 QRS Duration: 94 QT Interval:  364 QTC Calculation: 409 R Axis:   -5 Text Interpretation: Normal sinus rhythm Left ventricular hypertrophy with repolarization abnormality ( R in aVL ) Abnormal ECG Confirmed by Madalyn Rob [id number]) on 01/14/2019 3:38:23 PM   Radiology CT Abdomen Pelvis W Contrast  Result Date: 01/14/2019 CLINICAL DATA:  53 year old acute onset of epigastric abdominal pain that began at 3 o'clock a.m. this morning and is associated with vomiting. EXAM: CT ABDOMEN AND PELVIS WITH CONTRAST TECHNIQUE: Multidetector CT imaging of the abdomen and pelvis was performed using the standard protocol following bolus administration of intravenous contrast. CONTRAST:  148mL OMNIPAQUE IOHEXOL 300 MG/ML IV. COMPARISON:  None. FINDINGS: Lower chest: Minimal linear scar or atelectasis in the RIGHT LOWER LOBE, RIGHT MIDDLE LOBE and lingula. Visualized lung bases  otherwise clear. Normal heart size. Hepatobiliary: Solitary approximate 8.2 x 6.0 x 6.1 cm cystic mass with enhancing internal septation involving the MEDIAL segment LEFT lobe of liver. Gallbladder normal in appearance without calcified gallstones. No biliary ductal dilation. Pancreas: Normal in appearance without evidence of mass, ductal dilation, or inflammation. Spleen: Normal in size and appearance. Adrenals/Urinary Tract: Normal appearing adrenal glands. Kidneys normal in size and appearance without focal parenchymal abnormality. No hydronephrosis. No evidence of urinary tract calculi. Normal appearing urinary bladder. Stomach/Bowel: Stomach normal in appearance for the degree of distention. Inspissated  stool like material in the distal and terminal ileum, likely indicating stasis. Remaining small bowel normal in appearance. Transverse colon, descending colon and sigmoid colon decompressed. Scattered distal descending colonic diverticula without evidence of acute diverticulitis. Apparent thickening of the wall of the transverse colon is likely due to under distension. Normal appendix in the RIGHT UPPER pelvis. Vascular/Lymphatic: Minimal atherosclerosis involving the abdominal aorta and the RIGHT common iliac artery without evidence of aneurysm. Normal-appearing portal venous and systemic venous systems. No pathologic lymphadenopathy. Reproductive: Prostate gland and seminal vesicles normal in size and appearance for age. Other: Very small umbilical hernia containing fat. Musculoskeletal: Benign appearing mixed lucent and sclerotic lesion involving the greater trochanter of the LEFT femoral neck. No acute findings IMPRESSION: 1. No acute abnormalities involving the abdomen or pelvis. 2. Cystic mass with enhancing internal septations involving the MEDIAL segment LEFT lobe of liver, measuring up to approximately 8 cm. A biliary cystadenoma or cystadenocarcinoma might have this appearance. A non-emergent MRI of  the abdomen without and with contrast may be helpful to further characterize this mass. 3. Inspissated stool like material in the distal and terminal ileum, likely indicating stasis. No evidence of bowel obstruction. 4. Scattered distal descending colonic diverticula without evidence of acute diverticulitis. 5. Benign-appearing mixed lucent and sclerotic lesion involving the greater trochanter of the LEFT femoral neck. Aortic Atherosclerosis, minimal.  (ICD10-I70.0). Electronically Signed   By: Evangeline Dakin M.D.   On: 01/14/2019 18:05    Procedures Procedures (including critical care time)  Medications Ordered in ED Medications  sodium chloride flush (NS) 0.9 % injection 3 mL (3 mLs Intravenous Given by Other 01/14/19 1950)  sodium chloride 0.9 % bolus 1,000 mL (0 mLs Intravenous Stopped 01/14/19 1950)  morphine 4 MG/ML injection 4 mg (4 mg Intravenous Given 01/14/19 1646)  ondansetron (ZOFRAN) injection 4 mg (4 mg Intravenous Given 01/14/19 1645)  iohexol (OMNIPAQUE) 300 MG/ML solution 100 mL (100 mLs Intravenous Contrast Given 01/14/19 1743)  alum & mag hydroxide-simeth (MAALOX/MYLANTA) 200-200-20 MG/5ML suspension 30 mL (30 mLs Oral Given 01/14/19 1949)  morphine 4 MG/ML injection 4 mg (4 mg Intravenous Given 01/14/19 2015)    ED Course  I have reviewed the triage vital signs and the nursing notes.  Pertinent labs & imaging results that were available during my care of the patient were reviewed by me and considered in my medical decision making (see chart for details).  53 year old male presents with diffuse upper adominal pain with N/V since this AM. DDx: gastritis, gastroenteritis, PUD, pancreatitis, biliary or liver etiology. He is hypertensive but otherwise vitals are normal. He is diffusely tender across the upper abdomen but it's worse in the epigastric region. Labs were reviewed - he has mild elevation of LFTs. Will order pain control, fluids, nausea meds and order CT A/P  CT  shows liver cyst which could be benign or malignant. Outpatient w/u recommended. I don't think this is the cause of his pain today. He feels better after meds and Gi cocktail. He tolerated PO. Will d/c with close PCP or GI f/u. He was given medicine for pain and a PPI.  MDM Rules/Calculators/A&P                       Final Clinical Impression(s) / ED Diagnoses Final diagnoses:  Epigastric pain    Rx / DC Orders ED Discharge Orders    None       Recardo Evangelist, PA-C 01/14/19 2115  Lucrezia Starch, MD 01/15/19 1438

## 2019-01-14 NOTE — ED Triage Notes (Addendum)
C/O waking up @ approx 0200 with constant epigastric pain with approx 6 episodes vomiting.  Denies any chest pain, states it's in his stomach".  Pain worse with laying down. Denies fever, chills, or other sxs.

## 2019-01-14 NOTE — ED Notes (Signed)
Pt returned from CT °

## 2019-01-14 NOTE — ED Notes (Signed)
Patient is being discharged from the Urgent [facility] and sent to the Emergency Department via personal vehicle by family member. Per provider Debara Pickett, patient is stable but in need of higher level of care due to sudden chest pain of unknown source. Patient is aware and verbalizes understanding of plan of care.   Vitals:   01/14/19 1106  BP: (!) 166/103  Pulse: 83  Resp: 18  Temp: 98.3 F (36.8 C)  SpO2: 100%

## 2019-01-14 NOTE — ED Notes (Signed)
Pt to CT at this time.

## 2019-01-14 NOTE — ED Triage Notes (Signed)
C/o mid upper abd/epigastric pain since 2am with vomiting.  Denies diarrhea or urinary complaint.  Pt went to [facility] and sent to ED for further eval.

## 2019-02-02 ENCOUNTER — Other Ambulatory Visit: Payer: Self-pay | Admitting: Family Medicine

## 2019-02-02 DIAGNOSIS — R160 Hepatomegaly, not elsewhere classified: Secondary | ICD-10-CM

## 2019-02-10 ENCOUNTER — Other Ambulatory Visit: Payer: Self-pay | Admitting: Family Medicine

## 2019-02-11 ENCOUNTER — Ambulatory Visit
Admission: RE | Admit: 2019-02-11 | Discharge: 2019-02-11 | Disposition: A | Discharge status: Home / Self Care | Payer: PRIVATE HEALTH INSURANCE | Source: Ambulatory Visit | Attending: Family Medicine | Admitting: Family Medicine

## 2019-02-11 ENCOUNTER — Other Ambulatory Visit: Payer: PRIVATE HEALTH INSURANCE

## 2019-02-11 ENCOUNTER — Other Ambulatory Visit: Payer: Self-pay

## 2019-02-11 DIAGNOSIS — R160 Hepatomegaly, not elsewhere classified: Secondary | ICD-10-CM

## 2019-02-11 MED ORDER — GADOBENATE DIMEGLUMINE 529 MG/ML IV SOLN
20.0000 mL | Freq: Once | INTRAVENOUS | Status: AC | PRN
  Administered 2019-02-11: 12:00:00 20 mL via INTRAVENOUS

## 2019-02-25 ENCOUNTER — Other Ambulatory Visit: Payer: PRIVATE HEALTH INSURANCE

## 2019-07-05 ENCOUNTER — Other Ambulatory Visit (HOSPITAL_COMMUNITY): Payer: Self-pay | Admitting: Transplant Surgery

## 2019-07-05 DIAGNOSIS — D134 Benign neoplasm of liver: Secondary | ICD-10-CM

## 2019-07-06 ENCOUNTER — Other Ambulatory Visit (HOSPITAL_COMMUNITY): Payer: Self-pay | Admitting: Transplant Surgery

## 2019-07-06 DIAGNOSIS — Q446 Cystic disease of liver: Secondary | ICD-10-CM

## 2019-07-06 DIAGNOSIS — Z01818 Encounter for other preprocedural examination: Secondary | ICD-10-CM

## 2019-07-07 ENCOUNTER — Ambulatory Visit (HOSPITAL_COMMUNITY): Payer: PRIVATE HEALTH INSURANCE

## 2019-07-21 ENCOUNTER — Other Ambulatory Visit: Payer: Self-pay

## 2019-07-21 ENCOUNTER — Ambulatory Visit (HOSPITAL_COMMUNITY)
Admission: RE | Admit: 2019-07-21 | Discharge: 2019-07-21 | Disposition: A | Discharge status: Home / Self Care | Payer: PRIVATE HEALTH INSURANCE | Source: Ambulatory Visit | Attending: Transplant Surgery | Admitting: Transplant Surgery

## 2019-07-21 DIAGNOSIS — D134 Benign neoplasm of liver: Secondary | ICD-10-CM | POA: Diagnosis present

## 2019-07-21 MED ORDER — GADOBUTROL 1 MMOL/ML IV SOLN
9.0000 mL | Freq: Once | INTRAVENOUS | Status: AC | PRN
  Administered 2019-07-21: 9 mL via INTRAVENOUS

## 2019-08-21 ENCOUNTER — Other Ambulatory Visit (HOSPITAL_COMMUNITY): Payer: PRIVATE HEALTH INSURANCE

## 2021-03-25 DIAGNOSIS — R058 Other specified cough: Secondary | ICD-10-CM | POA: Insufficient documentation

## 2021-06-19 DIAGNOSIS — I10 Essential (primary) hypertension: Secondary | ICD-10-CM | POA: Insufficient documentation

## 2021-11-03 IMAGING — MR MR ABDOMEN WO/W CM
20 series · 48 of 48 positions shown · IV contrast (Gadavist)
Comparison: MR abdomen, 02/11/2019

CLINICAL DATA: Follow-up liver lesion

EXAM:
MRI ABDOMEN WITHOUT AND WITH CONTRAST
TECHNIQUE: Multiplanar multisequence MR imaging of the abdomen was performed
both before and after the administration of intravenous contrast.
CONTRAST:  9mL GADAVIST GADOBUTROL 1 MMOL/ML IV SOLN

[Series 4: cor haste · coronal · 6.0mm · 1.19mm/px · 2 of 32 slices shown]
[im 1/32]
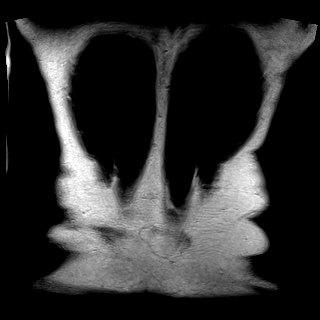
[im 32/32]
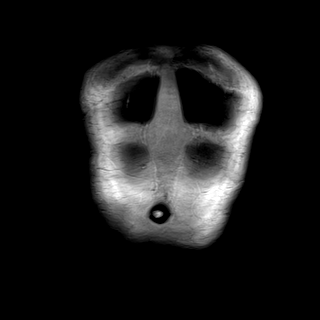

[Series 5: ax haste · axial · 6.0mm · 1.19mm/px · 1 of 32 slices shown]
[im 1/32]
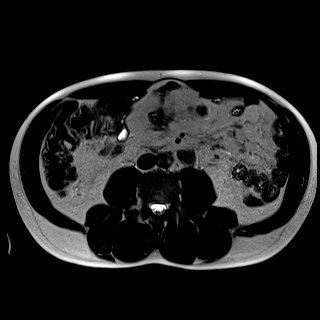

[Series 6: in phase (optional) · axial · 6.0mm · 0.74mm/px · 1 of 32 slices shown]
[im 1/32]
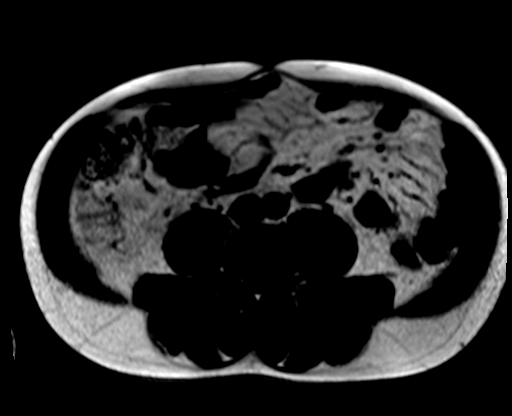

[Series 7: T1 · axial · 6.0mm · 0.74mm/px · 1 of 32 slices shown]
[im 1/32]
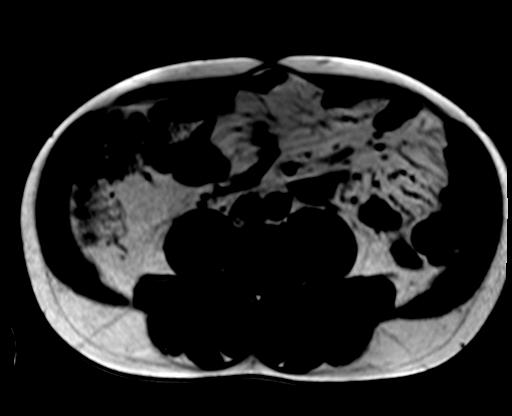

[Series 10: T2 fat-sat · axial · 6.0mm · 1.19mm/px · 1 of 32 slices shown]
[im 1/32]
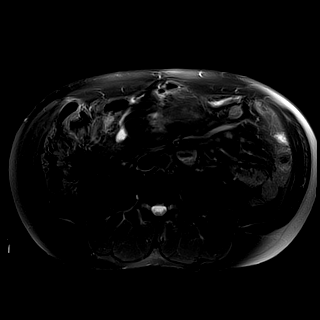

[Series 11: t1_vibe_opp-in_tra_p4_bh · axial · 3.0mm · 1.19mm/px · z∈[-150,+63]mm · 3 of 72 slices shown (1 of 2)]
[im 1/72]
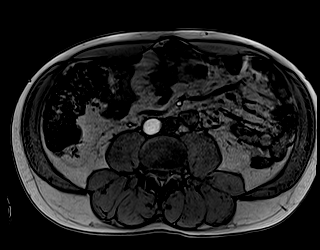
[im 36/72]
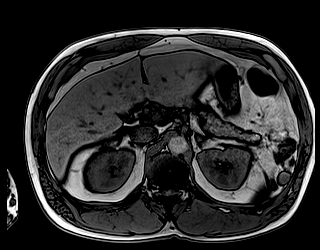
[im 72/72]
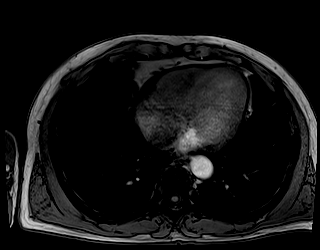

[Series 11: t1_vibe_opp-in_tra_p4_bh · axial · 3.0mm · 1.19mm/px · z∈[-150,+63]mm · 3 of 72 slices shown (2 of 2)]
[im 1/72]
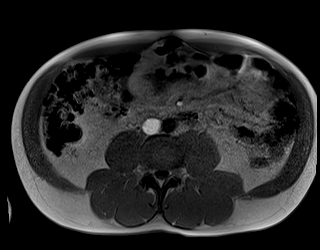
[im 36/72]
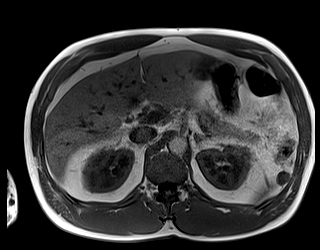
[im 72/72]
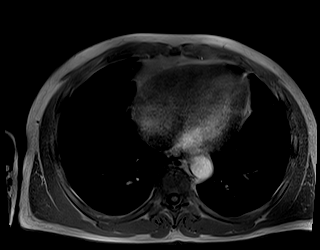

[Series 12: DWI · axial · 6.0mm · 1.42mm/px · z∈[-140,+83]mm · 4 of 96 slices shown (1 of 2)]
[im 1/96]
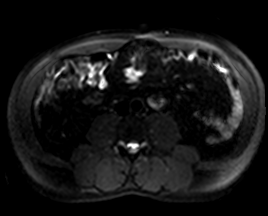
[im 32/96]
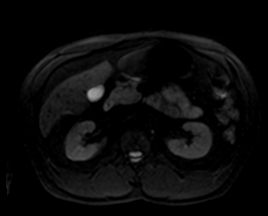
[im 64/96]
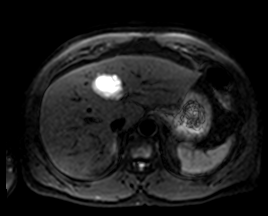
[im 96/96]
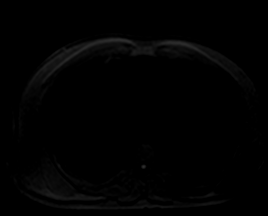

[Series 13: DWI · axial · 6.0mm · 1.42mm/px · 1 of 32 slices shown (2 of 2)]
[im 1/32]
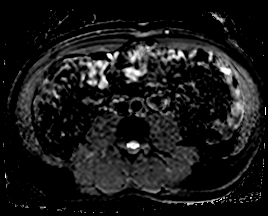

[Series 14: bSSFP · axial · 6.0mm · 0.74mm/px · 1 of 32 slices shown]
[im 1/32]
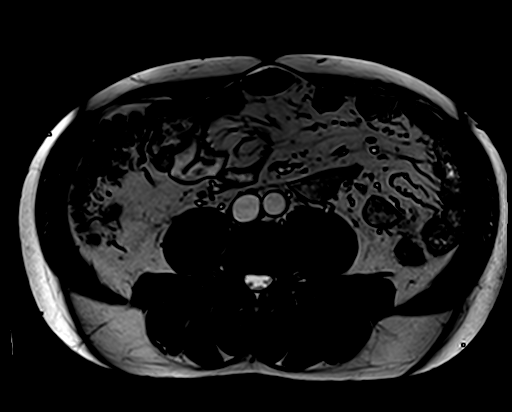

[Series 15: t1_vibe_fs_tra_p4_bh_pre · axial · 3.0mm · 1.19mm/px · z∈[-150,+63]mm · 3 of 72 slices shown]
[im 1/72]
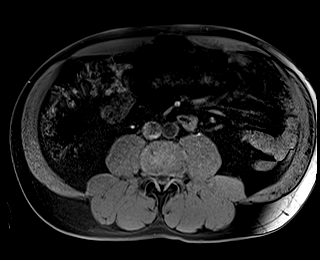
[im 36/72]
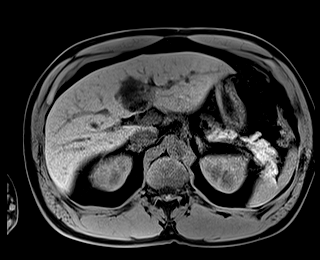
[im 72/72]
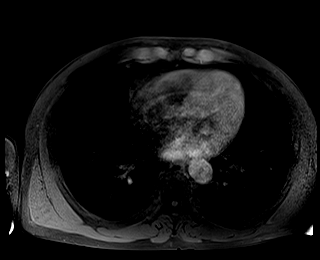

[Series 17: t1_vibe_fs_tra_p4_bh_post · axial · 3.0mm · 1.19mm/px · z∈[-150,+63]mm · 3 of 72 slices shown (1 of 4)]
[im 1/72]
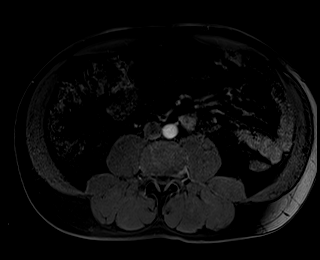
[im 36/72]
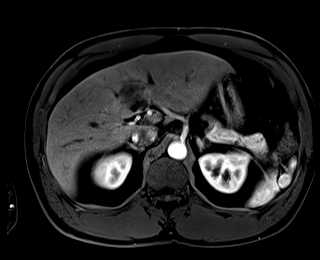
[im 72/72]
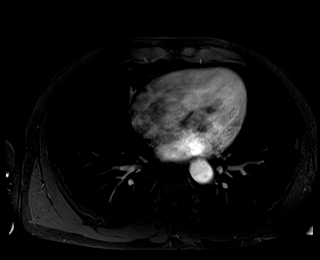

[Series 18: t1_vibe_fs_tra_p4_bh_post_sub · axial · 3.0mm · 1.19mm/px · z∈[-150,+63]mm · 3 of 72 slices shown (1 of 4)]
[im 1/72]
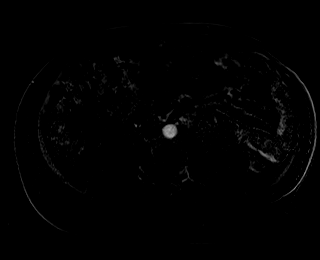
[im 36/72]
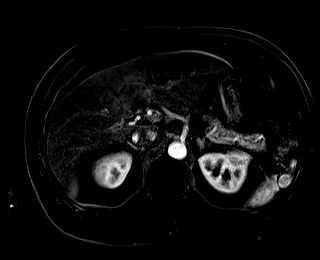
[im 72/72]
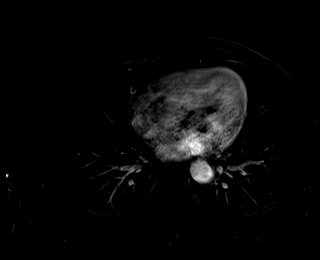

[Series 19: t1_vibe_fs_tra_p4_bh_post · axial · 3.0mm · 1.19mm/px · z∈[-150,+63]mm · 3 of 72 slices shown (2 of 4)]
[im 1/72]
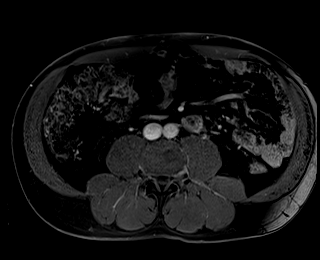
[im 36/72]
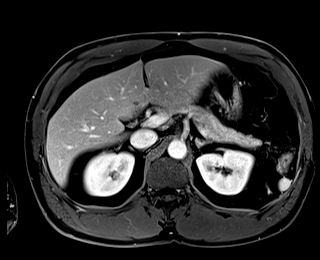
[im 72/72]
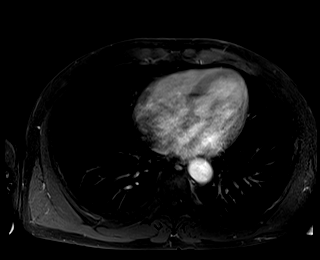

[Series 20: t1_vibe_fs_tra_p4_bh_post_sub · axial · 3.0mm · 1.19mm/px · z∈[-150,+63]mm · 3 of 72 slices shown (2 of 4)]
[im 1/72]
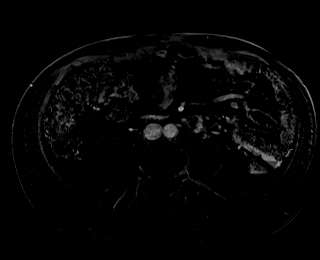
[im 36/72]
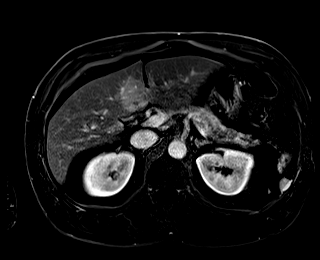
[im 72/72]
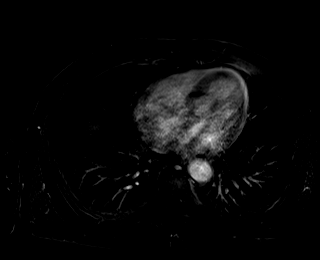

[Series 21: t1_vibe_fs_tra_p4_bh_post · axial · 3.0mm · 1.19mm/px · z∈[-150,+63]mm · 3 of 72 slices shown (3 of 4)]
[im 1/72]
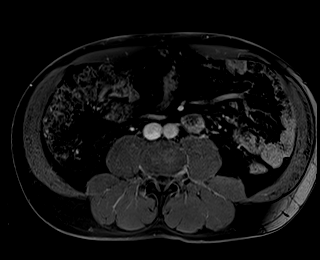
[im 36/72]
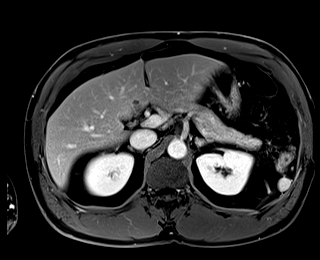
[im 72/72]
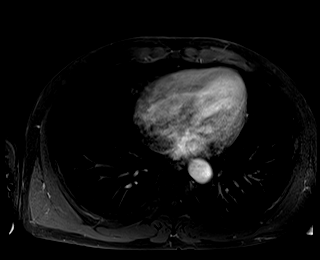

[Series 22: t1_vibe_fs_tra_p4_bh_post_sub · axial · 3.0mm · 1.19mm/px · z∈[-150,+63]mm · 3 of 72 slices shown (3 of 4)]
[im 1/72]
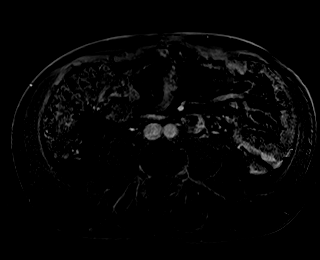
[im 36/72]
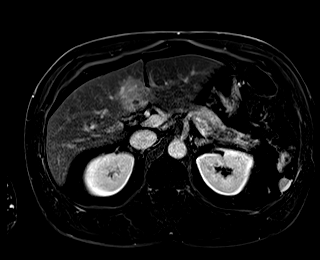
[im 72/72]
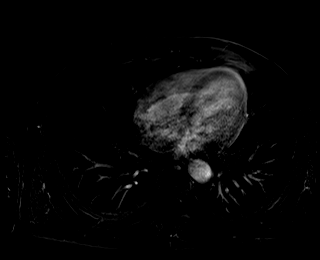

[Series 23: t1_vibe_fs_tra_p4_bh_post · axial · 3.0mm · 1.19mm/px · z∈[-150,+63]mm · 3 of 72 slices shown (4 of 4)]
[im 1/72]
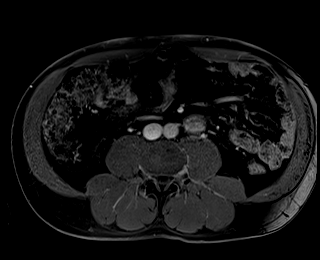
[im 36/72]
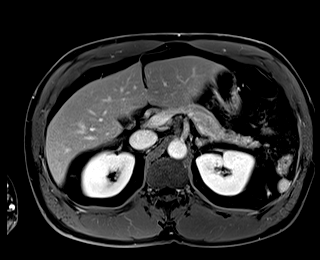
[im 72/72]
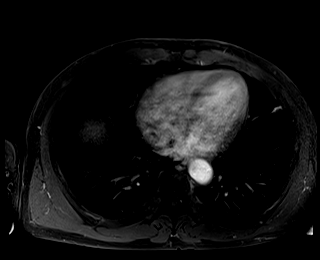

[Series 24: t1_vibe_fs_tra_p4_bh_post_sub · axial · 3.0mm · 1.19mm/px · z∈[-150,+63]mm · 3 of 72 slices shown (4 of 4)]
[im 1/72]
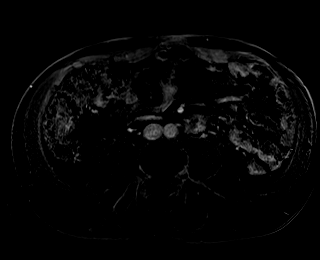
[im 36/72]
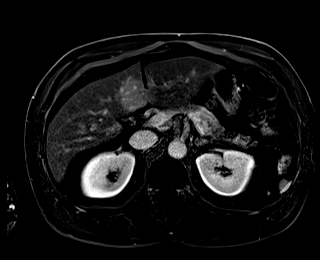
[im 72/72]
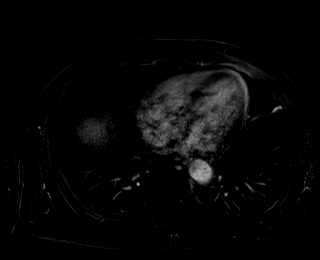

[Series 25: T1 dynamic post-contrast · coronal · 3.0mm · 1.31mm/px · 3 of 72 slices shown]
[im 1/72]
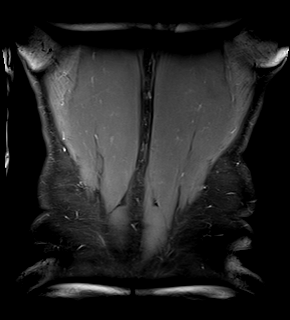
[im 36/72]
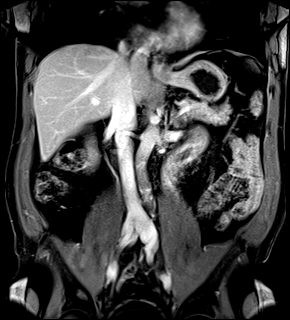
[im 72/72]
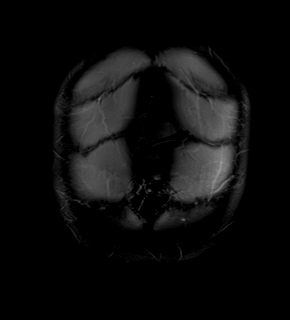

[48 of 48 positions shown; findings below may reference images not displayed]

FINDINGS: Lower chest: No acute findings.

Hepatobiliary: Redemonstrated, unchanged multi-cystic lesion of the
central right lobe of the liver, overall measuring approximately
x 4.3 cm, with a thin rim of enhancing peripheral epithelium (series
5, image 14). This does not obviously communicate to the biliary
system and there is no biliary ductal dilatation. No evidence of
solid nodularity or mass. No other mass or other parenchymal
abnormality identified.

Pancreas: No mass, inflammatory changes, or other parenchymal
abnormality identified.

Spleen:  Within normal limits in size and appearance.

Adrenals/Urinary Tract: No masses identified. No evidence of
hydronephrosis.

Stomach/Bowel: Visualized portions within the abdomen are
unremarkable.

Vascular/Lymphatic: No pathologically enlarged lymph nodes
identified. No abdominal aortic aneurysm demonstrated.

Other:  None.

Musculoskeletal: No suspicious bone lesions identified.
IMPRESSION: Unchanged multi-cystic lesion of the central right lobe of the
liver, overall measuring approximately 6.1 x 4.3 cm, with a thin rim
of enhancing peripheral epithelium. Findings again favor biliary
cystadenoma. Consider surgical referral and ongoing surveillance
given size and the general potential for malignant degeneration of
biliary cystadenomata.

## 2022-03-26 ENCOUNTER — Encounter: Payer: Self-pay | Admitting: Podiatry

## 2022-03-26 ENCOUNTER — Ambulatory Visit: Payer: No Typology Code available for payment source

## 2022-03-26 ENCOUNTER — Ambulatory Visit: Payer: No Typology Code available for payment source | Admitting: Podiatry

## 2022-03-26 VITALS — BP 162/100 | HR 69

## 2022-03-26 DIAGNOSIS — M722 Plantar fascial fibromatosis: Secondary | ICD-10-CM | POA: Diagnosis not present

## 2022-03-26 MED ORDER — TRIAMCINOLONE ACETONIDE 40 MG/ML IJ SUSP
20.0000 mg | Freq: Once | INTRAMUSCULAR | Status: AC
  Administered 2022-03-26: 20 mg

## 2022-03-26 MED ORDER — METHYLPREDNISOLONE 4 MG PO TBPK: ORAL_TABLET | ORAL | 0 refills | Status: DC

## 2022-03-26 MED ORDER — MELOXICAM 15 MG PO TABS: 15.0000 mg | ORAL_TABLET | Freq: Every day | ORAL | 3 refills | Status: DC

## 2022-03-26 NOTE — Progress Notes (Signed)
  Subjective:  Patient ID: Clifford Mejia, male    DOB: [birth year],  MRN: [id number] HPI Chief Complaint  Patient presents with   Foot Pain    Plantar heel right - aching x few months, AM pain, walks about 10 miles [place] weekend at the park, no treatment, sometimes arch of left is tender   New Patient (Initial Visit)    57 y.o. male presents with the above complaint.   ROS: Denies fever chills nausea vomit muscle aches pains calf pain back pain chest pain shortness of breath.  Past Medical History:  Diagnosis Date   Hypertension    no meds prescribed   Past Surgical History:  Procedure Laterality Date   LEG SURGERY     "laser"   WRIST SURGERY Left     Current Outpatient Medications:    amLODipine (NORVASC) 10 MG tablet, Take 10 mg by mouth daily., Disp: , Rfl:    meloxicam (MOBIC) 15 MG tablet, Take 1 tablet (15 mg total) by mouth daily., Disp: 30 tablet, Rfl: 3   methylPREDNISolone (MEDROL DOSEPAK) 4 MG TBPK tablet, 6 day dose pack - take as directed, Disp: 21 tablet, Rfl: 0   sildenafil (VIAGRA) 25 MG tablet, Take by mouth., Disp: , Rfl:   No Known Allergies Review of Systems Objective:   Vitals:   03/26/22 1458  BP: (!) 162/100  Pulse: 69    General: Well developed, nourished, in no acute distress, alert and oriented x3   Dermatological: Skin is warm, dry and supple bilateral. Nails x 10 are well maintained; remaining integument appears unremarkable at this time. There are no open sores, no preulcerative lesions, no rash or signs of infection present.  Vascular: Dorsalis Pedis artery and Posterior Tibial artery pedal pulses are 2/4 bilateral with immedate capillary fill time. Pedal hair growth present. No varicosities and no lower extremity edema present bilateral.   Neruologic: Grossly intact via light touch bilateral. Vibratory intact via tuning fork bilateral. Protective threshold with Semmes Wienstein monofilament intact to all pedal sites bilateral. Patellar  and Achilles deep tendon reflexes 2+ bilateral. No Babinski or clonus noted bilateral.   Musculoskeletal: No gross boney pedal deformities bilateral. No pain, crepitus, or limitation noted with foot and ankle range of motion bilateral. Muscular strength 5/5 in all groups tested bilateral.  Pain on palpation medial calcaneal tubercle of the right heel.  No pain on medial lateral compression of the calcaneus.  Mild pes planus is noted.  Gait: Unassisted, Nonantalgic.    Radiographs:  Radiographs taken today demonstrate osseously mature individual soft tissue increase in density plantar fashion calcaneal insertion site.  No other significant osseous abnormalities no acute findings are noted.  Mild pes planus noted.  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  Plan: Injected the right heel today 20 mg Kenalog 5 mg Marcaine.  Started him on methylprednisolone to be followed by meloxicam.  Placed in a plantar fascial brace right.  Discussed appropriate shoe gear stretching exercise ice therapy and sugar modifications.  Would like to follow-up with him in 1 month questions or concerns notify us immediately.     Azazel Franze T. [place], [place]

## 2022-05-07 ENCOUNTER — Ambulatory Visit: Payer: No Typology Code available for payment source | Admitting: Podiatry

## 2022-05-07 ENCOUNTER — Encounter: Payer: Self-pay | Admitting: Podiatry

## 2022-05-07 DIAGNOSIS — M722 Plantar fascial fibromatosis: Secondary | ICD-10-CM

## 2022-05-07 MED ORDER — TRIAMCINOLONE ACETONIDE 40 MG/ML IJ SUSP
20.0000 mg | Freq: Once | INTRAMUSCULAR | Status: AC
  Administered 2022-05-07: 20 mg

## 2022-05-07 NOTE — Progress Notes (Signed)
He presents today for follow-up of his Planter fasciitis of her right foot states is about 70 to 75% improved but still some discomfort in the middle of the night when he gets up to go to the bathroom.  Objective: Vital signs stable he is alert and oriented x 3.  He has pain on palpation medial calcaneal tubercle of his right heel.  There is no longer warm and swollen.  Assessment: Resolving Planter fasciitis.  Plan: Encouraged him to use the plantar fascia brace during the daytime continue with the oral anti-inflammatories I reinjected the right heel today 20 mg Kenalog 5 mg Marcaine point of maximal tenderness.  Dispensed a night splint with instructions and will follow-up with him in 1 month or so.

## 2022-06-11 ENCOUNTER — Ambulatory Visit: Payer: No Typology Code available for payment source | Admitting: Podiatry

## 2022-06-11 DIAGNOSIS — M722 Plantar fascial fibromatosis: Secondary | ICD-10-CM | POA: Diagnosis not present

## 2022-06-11 MED ORDER — TRIAMCINOLONE ACETONIDE 40 MG/ML IJ SUSP: 20.0000 mg | Freq: Once | INTRAMUSCULAR | Status: DC

## 2022-06-15 NOTE — Progress Notes (Signed)
He presents today for follow-up of his Planter fasciitis.  States that is approximately 80% better.  Objective: Vital signs are stable alert and x 3.  Pulses are palpable.  Still has pain on palpation medial calcaneal tubercle.  Assessment: Planter fasciitis.  Plan: Injected the foot again today third injection.  Also had him scanned for orthotics.

## 2022-06-24 ENCOUNTER — Ambulatory Visit: Payer: No Typology Code available for payment source

## 2022-06-24 DIAGNOSIS — M722 Plantar fascial fibromatosis: Secondary | ICD-10-CM

## 2022-06-24 NOTE — Progress Notes (Signed)
Patient presents today to pick up custom orthotic insoles.  Patient was dispensed 1 pair of custom orthotics . Fit was satisfactory. Instructions for break-in and wear was reviewed and a copy was given to the patient.   Re-appointment for regularly scheduled diabetic foot care visits or if they should experience any trouble with the shoes or insoles.

## 2022-07-07 ENCOUNTER — Telehealth: Payer: Self-pay | Admitting: Podiatry

## 2022-07-07 NOTE — Telephone Encounter (Signed)
No answer vm is full ,  tried to reach pt to schedule him to pick up orthotics    Balance pending

## 2022-07-21 ENCOUNTER — Other Ambulatory Visit: Payer: Self-pay | Admitting: Podiatry

## 2022-09-11 ENCOUNTER — Other Ambulatory Visit (HOSPITAL_COMMUNITY): Payer: Self-pay | Admitting: Transplant Surgery

## 2022-09-11 DIAGNOSIS — D136 Benign neoplasm of pancreas: Secondary | ICD-10-CM

## 2022-09-11 DIAGNOSIS — K7689 Other specified diseases of liver: Secondary | ICD-10-CM

## 2022-09-24 ENCOUNTER — Ambulatory Visit (HOSPITAL_COMMUNITY)
Admission: RE | Admit: 2022-09-24 | Discharge: 2022-09-24 | Disposition: A | Discharge status: Home / Self Care | Payer: PRIVATE HEALTH INSURANCE | Source: Ambulatory Visit | Attending: Transplant Surgery | Admitting: Transplant Surgery

## 2022-09-24 DIAGNOSIS — D136 Benign neoplasm of pancreas: Secondary | ICD-10-CM | POA: Diagnosis present

## 2022-09-24 DIAGNOSIS — K7689 Other specified diseases of liver: Secondary | ICD-10-CM | POA: Insufficient documentation

## 2022-09-24 MED ORDER — GADOBUTROL 1 MMOL/ML IV SOLN
10.0000 mL | Freq: Once | INTRAVENOUS | Status: AC | PRN
  Administered 2022-09-24: 10 mL via INTRAVENOUS

## 2023-04-29 ENCOUNTER — Telehealth: Payer: PRIVATE HEALTH INSURANCE | Admitting: Physician Assistant

## 2023-04-29 DIAGNOSIS — I10 Essential (primary) hypertension: Secondary | ICD-10-CM

## 2023-04-29 DIAGNOSIS — R110 Nausea: Secondary | ICD-10-CM

## 2023-04-30 ENCOUNTER — Telehealth: Payer: PRIVATE HEALTH INSURANCE | Admitting: Physician Assistant

## 2023-04-30 DIAGNOSIS — E7800 Pure hypercholesterolemia, unspecified: Secondary | ICD-10-CM | POA: Diagnosis not present

## 2023-04-30 DIAGNOSIS — M25561 Pain in right knee: Secondary | ICD-10-CM

## 2023-04-30 DIAGNOSIS — M25562 Pain in left knee: Secondary | ICD-10-CM | POA: Diagnosis not present

## 2023-04-30 DIAGNOSIS — I10 Essential (primary) hypertension: Secondary | ICD-10-CM | POA: Diagnosis not present

## 2023-04-30 MED ORDER — AMLODIPINE BESYLATE 10 MG PO TABS: 10.0000 mg | ORAL_TABLET | Freq: Every day | ORAL | 0 refills | Status: DC

## 2023-04-30 MED ORDER — ATORVASTATIN CALCIUM 10 MG PO TABS
10.0000 mg | ORAL_TABLET | Freq: Every day | ORAL | 0 refills | Status: DC
Start: 2023-04-30 — End: 2023-05-27

## 2023-04-30 MED ORDER — MELOXICAM 15 MG PO TABS: 15.0000 mg | ORAL_TABLET | Freq: Every day | ORAL | 0 refills | Status: AC

## 2023-04-30 NOTE — Progress Notes (Signed)
   Thank you for the details you included in the comment boxes. Those details are very helpful in determining the best course of treatment for you and help [place] to provide the best care. We recommend that you schedule a Virtual Urgent Care video visit in order for the provider to better assess what is going on.  The provider will be able to give you a more accurate diagnosis and treatment plan if we can more freely discuss your symptoms and with the addition of a virtual examination.   If you change your visit to a video visit, we will bill your insurance (similar to an office visit) and you will not be charged for this e-Visit. You will be able to stay at home and speak with the first available [facility] advanced practice provider. The link to do a video visit is in the drop down Menu tab of your Welcome screen in MyChart.       I have spent 5 minutes in review of e-visit questionnaire, review and updating patient chart, medical decision making and response to patient.   Margaretann Loveless, PA-C

## 2023-04-30 NOTE — Patient Instructions (Signed)
 Celine Ahr, thank you for joining Margaretann Loveless, PA-C for today's virtual visit.  While this provider is not your primary care provider (PCP), if your PCP is located in our provider database this encounter information will be shared with them immediately following your visit.   A [facility] MyChart account gives you access to today's visit and all your visits, tests, and labs performed at [facility] " click here if you don't have a [facility] MyChart account or go to [email or web address]  Consent: (Patient) Clifford Mejia provided verbal consent for this virtual visit at the beginning of the encounter.  Current Medications:  Current Outpatient Medications:    atorvastatin (LIPITOR) 10 MG tablet, Take 1 tablet (10 mg total) by mouth daily., Disp: 90 tablet, Rfl: 0   amLODipine (NORVASC) 10 MG tablet, Take 1 tablet (10 mg total) by mouth daily., Disp: 90 tablet, Rfl: 0   meloxicam (MOBIC) 15 MG tablet, Take 1 tablet (15 mg total) by mouth daily., Disp: 30 tablet, Rfl: 0   sildenafil (VIAGRA) 25 MG tablet, Take by mouth., Disp: , Rfl:   Current Facility-Administered Medications:    triamcinolone acetonide (KENALOG-40) injection 20 mg, 20 mg, Other, Once, Hyatt, Max T, DPM   Medications ordered in this encounter:  Meds ordered this encounter  Medications   amLODipine (NORVASC) 10 MG tablet    Sig: Take 1 tablet (10 mg total) by mouth daily.    Dispense:  90 tablet    Refill:  0    Supervising Provider:   Merrilee Jansky [[id number]]   atorvastatin (LIPITOR) 10 MG tablet    Sig: Take 1 tablet (10 mg total) by mouth daily.    Dispense:  90 tablet    Refill:  0    Supervising Provider:   Merrilee Jansky [[id number]]   meloxicam (MOBIC) 15 MG tablet    Sig: Take 1 tablet (15 mg total) by mouth daily.    Dispense:  30 tablet    Refill:  0    Supervising Provider:   Merrilee Jansky [[id number]]     *If you need refills on other medications prior to your  next appointment, please contact your pharmacy*  Follow-Up: Call back or seek an in-person evaluation if the symptoms worsen or if the condition fails to improve as anticipated.  [facility] [phone]  Other Instructions  Managing Your Hypertension Hypertension, also called high blood pressure, is when the force of the blood pressing against the walls of the arteries is too strong. Arteries are blood vessels that carry blood from your heart throughout your body. Hypertension forces the heart to work harder to pump blood and may cause the arteries to become narrow or stiff. Understanding blood pressure readings A blood pressure reading includes a higher number over a lower number: The first, or top, number is called the systolic pressure. It is a measure of the pressure in your arteries as your heart beats. The second, or bottom number, is called the diastolic pressure. It is a measure of the pressure in your arteries as the heart relaxes. For most people, a normal blood pressure is below 120/80. Your personal target blood pressure may vary depending on your medical conditions, your age, and other factors. Blood pressure is classified into four stages. Based on your blood pressure reading, your health care provider may use the following stages to determine what type of treatment you need, if any. Systolic pressure and diastolic pressure are measured in  a unit called millimeters of mercury (mmHg). Normal Systolic pressure: below 120. Diastolic pressure: below 80. Elevated Systolic pressure: 120-129. Diastolic pressure: below 80. Hypertension stage 1 Systolic pressure: 130-139. Diastolic pressure: 80-89. Hypertension stage 2 Systolic pressure: 140 or above. Diastolic pressure: 90 or above. How can this condition affect me? Managing your hypertension is very important. Over time, hypertension can damage the arteries and decrease blood flow to parts of the body, including  the brain, heart, and kidneys. Having untreated or uncontrolled hypertension can lead to: A heart attack. A stroke. A weakened blood vessel (aneurysm). Heart failure. Kidney damage. Eye damage. Memory and concentration problems. Vascular dementia. What actions can I take to manage this condition? Hypertension can be managed by making lifestyle changes and possibly by taking medicines. Your health care provider will help you make a plan to bring your blood pressure within a normal range. You may be referred for counseling on a healthy diet and physical activity. Nutrition  Eat a diet that is high in fiber and potassium, and low in salt (sodium), added sugar, and fat. An example eating plan is called the DASH diet. DASH stands for Dietary Approaches to Stop Hypertension. To eat this way: Eat plenty of fresh fruits and vegetables. Try to fill one-half of your plate at each meal with fruits and vegetables. Eat whole grains, such as whole-wheat pasta, brown rice, or whole-grain bread. Fill about one-fourth of your plate with whole grains. Eat low-fat dairy products. Avoid fatty cuts of meat, processed or cured meats, and poultry with skin. Fill about one-fourth of your plate with lean proteins such as fish, chicken without skin, beans, eggs, and tofu. Avoid pre-made and processed foods. These tend to be higher in sodium, added sugar, and fat. Reduce your daily sodium intake. Many people with hypertension should eat less than 1,500 mg of sodium a day. Lifestyle  Work with your health care provider to maintain a healthy body weight or to lose weight. Ask what an ideal weight is for you. Get at least 30 minutes of exercise that causes your heart to beat faster (aerobic exercise) most days of the week. Activities may include walking, swimming, or biking. Include exercise to strengthen your muscles (resistance exercise), such as weight lifting, as part of your weekly exercise routine. Try to do these  types of exercises for 30 minutes at least 3 days a week. Do not use any products that contain nicotine or tobacco. These products include cigarettes, chewing tobacco, and vaping devices, such as e-cigarettes. If you need help quitting, ask your health care provider. Control any long-term (chronic) conditions you have, such as high cholesterol or diabetes. Identify your sources of stress and find ways to manage stress. This may include meditation, deep breathing, or making time for fun activities. Alcohol use Do not drink alcohol if: Your health care provider tells you not to drink. You are pregnant, may be pregnant, or are planning to become pregnant. If you drink alcohol: Limit how much you have to: 0-1 drink a day for women. 0-2 drinks a day for men. Know how much alcohol is in your drink. In the [place] one drink equals one 12 oz bottle of beer (355 mL), one 5 oz glass of wine (148 mL), or one 1 oz glass of hard liquor (44 mL). Medicines Your health care provider may prescribe medicine if lifestyle changes are not enough to get your blood pressure under control and if: Your systolic blood pressure is 130 or higher. Your  diastolic blood pressure is 80 or higher. Take medicines only as told by your health care provider. Follow the directions carefully. Blood pressure medicines must be taken as told by your health care provider. The medicine does not work as well when you skip doses. Skipping doses also puts you at risk for problems. Monitoring Before you monitor your blood pressure: Do not smoke, drink caffeinated beverages, or exercise within 30 minutes before taking a measurement. Use the bathroom and empty your bladder (urinate). Sit quietly for at least 5 minutes before taking measurements. Monitor your blood pressure at home as told by your health care provider. To do this: Sit with your back straight and supported. Place your feet flat on the floor. Do not cross your legs. Support  your arm on a flat surface, such as a table. Make sure your upper arm is at heart level. Each time you measure, take two or three readings one minute apart and record the results. You may also need to have your blood pressure checked regularly by your health care provider. General information Talk with your health care provider about your diet, exercise habits, and other lifestyle factors that may be contributing to hypertension. Review all the medicines you take with your health care provider because there may be side effects or interactions. Keep all follow-up visits. Your health care provider can help you create and adjust your plan for managing your high blood pressure. Where to find more information National Heart, Lung, and Blood Institute: PopSteam.is American Heart Association: www.heart.org Contact a health care provider if: You think you are having a reaction to medicines you have taken. You have repeated (recurrent) headaches. You feel dizzy. You have swelling in your ankles. You have trouble with your vision. Get help right away if: You develop a severe headache or confusion. You have unusual weakness or numbness, or you feel faint. You have severe pain in your chest or abdomen. You vomit repeatedly. You have trouble breathing. These symptoms may be an emergency. Get help right away. Call 911. Do not wait to see if the symptoms will go away. Do not drive yourself to the hospital. Summary Hypertension is when the force of blood pumping through your arteries is too strong. If this condition is not controlled, it may put you at risk for serious complications. Your personal target blood pressure may vary depending on your medical conditions, your age, and other factors. For most people, a normal blood pressure is less than 120/80. Hypertension is managed by lifestyle changes, medicines, or both. Lifestyle changes to help manage hypertension include losing weight, eating a  healthy, low-sodium diet, exercising more, stopping smoking, and limiting alcohol. This information is not intended to replace advice given to you by your health care provider. Make sure you discuss any questions you have with your health care provider. Document Revised: 09/19/2020 Document Reviewed: 09/19/2020 Elsevier Patient Education  2024 Elsevier Inc.   If you have been instructed to have an in-person evaluation today at a local Urgent Care facility, please use the link below. It will take you to a list of all of our available [facility], including address, phone number and hours of operation. Please do not delay care.  [facility]  If you or a family member do not have a primary care provider, use the link below to schedule a visit and establish care. When you choose a [facility] primary care physician or advanced practice provider, you gain a long-term partner  in health. Find a Primary Care Provider  Learn more about [facility] in-office and virtual care options: [facility] - Get Care Now

## 2023-04-30 NOTE — Progress Notes (Signed)
 Virtual Visit Consent   Clifford Mejia, you are scheduled for a virtual visit with a [facility] provider today. Just as with appointments in the office, your consent must be obtained to participate. Your consent will be active for this visit and any virtual visit you may have with one of our providers in the next 365 days. If you have a MyChart account, a copy of this consent can be sent to you electronically.  As this is a virtual visit, video technology does not allow for your provider to perform a traditional examination. This may limit your provider's ability to fully assess your condition. If your provider identifies any concerns that need to be evaluated in person or the need to arrange testing (such as labs, EKG, etc.), we will make arrangements to do so. Although advances in technology are sophisticated, we cannot ensure that it will always work on either your end or our end. If the connection with a video visit is poor, the visit may have to be switched to a telephone visit. With either a video or telephone visit, we are not always able to ensure that we have a secure connection.  By engaging in this virtual visit, you consent to the provision of healthcare and authorize for your insurance to be billed (if applicable) for the services provided during this visit. Depending on your insurance coverage, you may receive a charge related to this service.  I need to obtain your verbal consent now. Are you willing to proceed with your visit today? Clifford Mejia has provided verbal consent on 04/30/2023 for a virtual visit (video or telephone). Margaretann Loveless, PA-C  Date: 04/30/2023 7:11 PM   Virtual Visit via Video Note   I, Margaretann Loveless, connected with  Clifford Mejia  ([id number], 02-09-1965) on 04/30/23 at  7:00 PM EDT by a video-enabled telemedicine application and verified that I am speaking with the correct person using two identifiers.  Location: Patient: Virtual Visit  Location Patient: Home Provider: Virtual Visit Location Provider: Home Office   I discussed the limitations of evaluation and management by telemedicine and the availability of in person appointments. The patient expressed understanding and agreed to proceed.    History of Present Illness: Clifford Mejia is a 58 y.o. who identifies as a male who was assigned male at birth, and is being seen today for HTN. Had some nausea yesterday. Feels it may be because he has been out of his BP medications. He has been out for about a month.BP over last couple of days has been 149/92.  Also out of cholesterol medication.  Also having some knee pain bilaterally. Worst with bending down to pick things up. Had issue last year and PCP had offered for watchful waiting.   Insurance changed and lost his PCP. Establishing with a new PCP on 05/27/23.    Problems:  Patient Active Problem List   Diagnosis Date Noted   Recurrent productive cough 03/25/2021    Allergies: No Known Allergies Medications:  Current Outpatient Medications:    atorvastatin (LIPITOR) 10 MG tablet, Take 1 tablet (10 mg total) by mouth daily., Disp: 90 tablet, Rfl: 0   amLODipine (NORVASC) 10 MG tablet, Take 1 tablet (10 mg total) by mouth daily., Disp: 90 tablet, Rfl: 0   meloxicam (MOBIC) 15 MG tablet, Take 1 tablet (15 mg total) by mouth daily., Disp: 30 tablet, Rfl: 0   sildenafil (VIAGRA) 25 MG tablet, Take by mouth., Disp: , Rfl:   Current Facility-Administered Medications:  triamcinolone acetonide (KENALOG-40) injection 20 mg, 20 mg, Other, Once, Hyatt, Max T, DPM  Observations/Objective: Patient is well-developed, well-nourished in no acute distress.  Resting comfortably at home.  Head is normocephalic, atraumatic.  No labored breathing.  Speech is clear and coherent with logical content.  Patient is alert and oriented at baseline.    Assessment and Plan: 1. Primary hypertension (Primary) - amLODipine (NORVASC) 10  MG tablet; Take 1 tablet (10 mg total) by mouth daily.  Dispense: 90 tablet; Refill: 0  2. Hypercholesterolemia - atorvastatin (LIPITOR) 10 MG tablet; Take 1 tablet (10 mg total) by mouth daily.  Dispense: 90 tablet; Refill: 0  3. Acute pain of both knees - meloxicam (MOBIC) 15 MG tablet; Take 1 tablet (15 mg total) by mouth daily.  Dispense: 30 tablet; Refill: 0  - Amlodipine refilled x 90 days once - Atorvastatin refilled x 90 days once - Meloxicam added for as needed use for knee pain - Keep scheduled follow up with PCP on 05/27/23 to establish care  Follow Up Instructions: I discussed the assessment and treatment plan with the patient. The patient was provided an opportunity to ask questions and all were answered. The patient agreed with the plan and demonstrated an understanding of the instructions.  A copy of instructions were sent to the patient via MyChart unless otherwise noted below.    The patient was advised to call back or seek an in-person evaluation if the symptoms worsen or if the condition fails to improve as anticipated.    Margaretann Loveless, PA-C

## 2023-05-27 ENCOUNTER — Encounter (HOSPITAL_BASED_OUTPATIENT_CLINIC_OR_DEPARTMENT_OTHER): Payer: Self-pay | Admitting: Family Medicine

## 2023-05-27 ENCOUNTER — Ambulatory Visit (INDEPENDENT_AMBULATORY_CARE_PROVIDER_SITE_OTHER): Payer: PRIVATE HEALTH INSURANCE | Admitting: Family Medicine

## 2023-05-27 ENCOUNTER — Ambulatory Visit (HOSPITAL_BASED_OUTPATIENT_CLINIC_OR_DEPARTMENT_OTHER): Payer: PRIVATE HEALTH INSURANCE

## 2023-05-27 VITALS — BP 154/91 | HR 68 | Ht 72.0 in | Wt 221.0 lb

## 2023-05-27 DIAGNOSIS — E7800 Pure hypercholesterolemia, unspecified: Secondary | ICD-10-CM | POA: Diagnosis not present

## 2023-05-27 DIAGNOSIS — G8929 Other chronic pain: Secondary | ICD-10-CM

## 2023-05-27 DIAGNOSIS — E785 Hyperlipidemia, unspecified: Secondary | ICD-10-CM

## 2023-05-27 DIAGNOSIS — M25561 Pain in right knee: Secondary | ICD-10-CM | POA: Diagnosis not present

## 2023-05-27 DIAGNOSIS — I10 Essential (primary) hypertension: Secondary | ICD-10-CM | POA: Diagnosis not present

## 2023-05-27 DIAGNOSIS — M25562 Pain in left knee: Secondary | ICD-10-CM

## 2023-05-27 DIAGNOSIS — Z0000 Encounter for general adult medical examination without abnormal findings: Secondary | ICD-10-CM

## 2023-05-27 MED ORDER — AMLODIPINE BESYLATE 10 MG PO TABS
10.0000 mg | ORAL_TABLET | Freq: Every day | ORAL | 1 refills | Status: DC
Start: 2023-05-27 — End: 2023-11-03

## 2023-05-27 MED ORDER — ATORVASTATIN CALCIUM 10 MG PO TABS
10.0000 mg | ORAL_TABLET | Freq: Every day | ORAL | 1 refills | Status: DC
Start: 2023-05-27 — End: 2023-11-03

## 2023-05-27 NOTE — Progress Notes (Signed)
 New Patient Office Visit  Subjective   Patient ID: Clifford Mejia, male    DOB: 1965/06/13  Age: 58 y.o. MRN: [id number]  CC:  Chief Complaint  Patient presents with   New Patient (Initial Visit)    New patient - just establishing care has not had bp medication and cholesterol medication just recently got refill through telehealth for 30 day supply last pcp [facility]     HPI Clifford Mejia presents to establish care Last PCP - Dr. Linnell Richardson  HLD: taking atorvastatin , no issues with medication.  HTN: taking amlodipine , no issues with medication. Has BP cuff, reports 120-130/80. No CP or HA.  Knee pain: chronic issue for patient. No prior imaging. Hurts with activities like squats. Prescribed meloxicam  by virtual provider.  Had liver surgery in [facility] [facility] - chart indicates bile duct cystadenoma. Has been doing well since surgery. Unsure if he needs to have further follow-up with specialist.  Patient is originally from [place], lives in [place] now. Patient works at [employer], works in receiving and driving. He enjoys walking regularly.  Outpatient Encounter Medications as of 05/27/2023  Medication Sig   meloxicam  (MOBIC ) 15 MG tablet Take 1 tablet (15 mg total) by mouth daily.   [DISCONTINUED] amLODipine  (NORVASC ) 10 MG tablet Take 1 tablet (10 mg total) by mouth daily.   [DISCONTINUED] atorvastatin  (LIPITOR) 10 MG tablet Take 1 tablet (10 mg total) by mouth daily.   amLODipine  (NORVASC ) 10 MG tablet Take 1 tablet (10 mg total) by mouth daily.   atorvastatin  (LIPITOR) 10 MG tablet Take 1 tablet (10 mg total) by mouth daily.   [DISCONTINUED] sildenafil (VIAGRA) 25 MG tablet Take by mouth.   [DISCONTINUED] triamcinolone  acetonide (KENALOG -40) injection 20 mg    No facility-administered encounter medications on file as of 05/27/2023.    Past Medical History:  Diagnosis Date   Hypertension    no meds prescribed    Past Surgical History:  Procedure  Laterality Date   CHOLECYSTECTOMY     LEG SURGERY     "laser"   WRIST SURGERY Left     Family History  Problem Relation Age of Onset   Hypertension Mother    Hypertension Father     Social History   Socioeconomic History   Marital status: Single    Spouse name: Not on file   Number of children: Not on file   Years of education: Not on file   Highest education level: 12th grade  Occupational History   Not on file  Tobacco Use   Smoking status: Never    Passive exposure: Never   Smokeless tobacco: Never   Tobacco comments:    I do not use tobacco  Vaping Use   Vaping status: Never Used  Substance and Sexual Activity   Alcohol use: Yes    Comment: rarely   Drug use: No   Sexual activity: Yes    Birth control/protection: None    Comment: Fiance  Other Topics Concern   Not on file  Social History Narrative   Not on file   Social Drivers of Health   Financial Resource Strain: Low Risk  (05/24/2023)   Overall Financial Resource Strain (CARDIA)    Difficulty of Paying Living Expenses: Not hard at all  Food Insecurity: No Food Insecurity (05/24/2023)   Hunger Vital Sign    Worried About Running Out of Food in the Last Year: Never true    Ran Out of Food in the Last Year: Never  true  Transportation Needs: No Transportation Needs (05/24/2023)   PRAPARE - Administrator, Civil Service (Medical): No    Lack of Transportation (Non-Medical): No  Physical Activity: Sufficiently Active (05/24/2023)   Exercise Vital Sign    Days of Exercise per Week: 6 days    Minutes of Exercise per Session: 140 min  Stress: No Stress Concern Present (05/24/2023)   [employer] of Occupational Health - Occupational Stress Questionnaire    Feeling of Stress : Not at all  Social Connections: Moderately Isolated (05/24/2023)   Social Connection and Isolation Panel [NHANES]    Frequency of Communication with Friends and Family: Three times a week    Frequency of Social Gatherings with  Friends and Family: Never    Attends Religious Services: More than 4 times per year    Active Member of [employer] or Organizations: No    Attends Engineer, structural: Not on file    Marital Status: Separated  Intimate Partner Violence: Not on file    Objective   BP (!) 154/91 (BP Location: Left Arm, Patient Position: Sitting, Cuff Size: Large)   Pulse 68   Ht 6' (1.829 m)   Wt 221 lb (100.2 kg)   SpO2 98%   BMI 29.97 kg/m   Physical Exam  58 year old male in no acute distress Cardiovascular exam with regular rate and rhythm Lungs clear to auscultation bilaterally  Assessment & Plan:   Primary hypertension Assessment & Plan: Blood pressure borderline in office today.  Recommend continuing with current medication regimen, recommend intermittent monitoring of blood pressure at home, DASH diet  Orders: -     amLODIPine  Besylate; Take 1 tablet (10 mg total) by mouth daily.  Dispense: 90 tablet; Refill: 1  Hyperlipidemia, unspecified hyperlipidemia type Assessment & Plan: No recent lipid panel available for review.  At this time, can continue with medication as is given that he has been tolerating this without issue.  Will plan to recheck cholesterol panel with upcoming labs   Chronic pain of both knees Assessment & Plan: Has not had specific evaluation in the past.  Concern for potential underlying osteoarthritis.  He would be open to having x-rays completed, however would prefer to have this done when he returns to have labs drawn which is reasonable.  Orders placed today  Orders: -     DG Knee Complete 4 Views Left; Future -     DG Knee Complete 4 Views Right; Future  Wellness examination -     CBC with Differential/Platelet; Future -     Comprehensive metabolic panel with GFR; Future -     Hemoglobin A1c; Future -     Lipid panel; Future -     TSH Rfx on Abnormal to Free T4; Future  Hypercholesterolemia -     Atorvastatin  Calcium ; Take 1 tablet (10 mg total)  by mouth daily.  Dispense: 90 tablet; Refill: 1  Return in about 3 months (around 08/27/2023) for CPE with fasting labs 1 week prior.    ___________________________________________ Angelisse Riso de Peru, MD, ABFM, CAQSM Primary Care and Sports Medicine [facility]

## 2023-05-27 NOTE — Patient Instructions (Signed)
  Medication Instructions:  Your physician recommends that you continue on your current medications as directed. Please refer to the Current Medication list given to you today. --If you need a refill on any your medications before your next appointment, please call your pharmacy first. If no refills are authorized on file call the office.-- Lab Work: Your physician has recommended that you have lab work today: fasting If you have labs (blood work) drawn today and your tests are completely normal, you will receive your results via MyChart message OR a phone call from our staff.  Please ensure you check your voicemail in the event that you authorized detailed messages to be left on a delegated number. If you have any lab test that is abnormal or we need to change your treatment, we will call you to review the results.  Referrals/Procedures/Imaging: Xray when your labs are drawn   Follow-Up: Your next appointment:   Your physician recommends that you schedule a follow-up appointment in: 2-3 months physical  with Dr. de Peru  You will receive a text message or e-mail with a link to a survey about your care and experience with us  today! We would greatly appreciate your feedback!   Thanks for letting us  be apart of your health journey!!  Primary Care and Sports Medicine   Dr. Court Distance Peru   We encourage you to activate your patient portal called "MyChart".  Sign up information is provided on this After Visit Summary.  MyChart is used to connect with patients for Virtual Visits (Telemedicine).  Patients are able to view lab/test results, encounter notes, upcoming appointments, etc.  Non-urgent messages can be sent to your provider as well. To learn more about what you can do with MyChart, please visit --  [email or web address].

## 2023-05-28 NOTE — Assessment & Plan Note (Signed)
 Blood pressure borderline in office today.  Recommend continuing with current medication regimen, recommend intermittent monitoring of blood pressure at home, DASH diet

## 2023-05-28 NOTE — Assessment & Plan Note (Signed)
 Has not had specific evaluation in the past.  Concern for potential underlying osteoarthritis.  He would be open to having x-rays completed, however would prefer to have this done when he returns to have labs drawn which is reasonable.  Orders placed today

## 2023-05-28 NOTE — Assessment & Plan Note (Signed)
 No recent lipid panel available for review.  At this time, can continue with medication as is given that he has been tolerating this without issue.  Will plan to recheck cholesterol panel with upcoming labs

## 2023-07-29 ENCOUNTER — Other Ambulatory Visit: Payer: Self-pay | Admitting: Podiatry

## 2023-07-29 DIAGNOSIS — M25561 Pain in right knee: Secondary | ICD-10-CM

## 2023-08-04 ENCOUNTER — Telehealth: Payer: Self-pay

## 2023-08-04 NOTE — Telephone Encounter (Signed)
 Orthotics have been thrown away/ recycled 07/07/2022-07/07/2023 Balance: $490.00 Attempted to contact 1x

## 2023-08-10 ENCOUNTER — Other Ambulatory Visit (HOSPITAL_BASED_OUTPATIENT_CLINIC_OR_DEPARTMENT_OTHER): Payer: Self-pay | Admitting: Family Medicine

## 2023-08-10 DIAGNOSIS — M25561 Pain in right knee: Secondary | ICD-10-CM

## 2023-08-10 NOTE — Telephone Encounter (Signed)
 Copied from CRM [id number]. Topic: Clinical - Medication Refill >> Aug 10, 2023  9:44 AM Carlatta H wrote: Medication:  meloxicam  (MOBIC ) 15 MG tablet     Has the patient contacted their pharmacy? No (Agent: If no, request that the patient contact the pharmacy for the refill. If patient does not wish to contact the pharmacy document the reason why and proceed with request.) (Agent: If yes, when and what did the pharmacy advise?)  This is the patient's preferred pharmacy:  [id number] [address], [place] - [address] AT [facility] [address] [facility] [address] [place] [address] Phone: [phone] Fax: [phone]    Is this the correct pharmacy for this prescription? Yes If no, delete pharmacy and type the correct one.   Has the prescription been filled recently? No  Is the patient out of the medication? Yes  Has the patient been seen for an appointment in the last year OR does the patient have an upcoming appointment? Yes  Can we respond through MyChart? Yes  Agent: Please be advised that Rx refills may take up to 3 business days. We ask that you follow-up with your pharmacy.

## 2023-09-02 ENCOUNTER — Encounter (HOSPITAL_BASED_OUTPATIENT_CLINIC_OR_DEPARTMENT_OTHER): Payer: PRIVATE HEALTH INSURANCE | Admitting: Family Medicine

## 2023-10-07 ENCOUNTER — Other Ambulatory Visit (HOSPITAL_COMMUNITY): Payer: Self-pay | Admitting: Transplant Hepatology

## 2023-10-07 DIAGNOSIS — D134 Benign neoplasm of liver: Secondary | ICD-10-CM

## 2023-10-14 ENCOUNTER — Ambulatory Visit (HOSPITAL_COMMUNITY)
Admission: RE | Admit: 2023-10-14 | Discharge: 2023-10-14 | Disposition: A | Discharge status: Home / Self Care | Payer: PRIVATE HEALTH INSURANCE | Source: Ambulatory Visit | Attending: Transplant Hepatology | Admitting: Transplant Hepatology

## 2023-10-14 DIAGNOSIS — D134 Benign neoplasm of liver: Secondary | ICD-10-CM | POA: Diagnosis present

## 2023-10-14 MED ORDER — GADOBUTROL 1 MMOL/ML IV SOLN
10.0000 mL | Freq: Once | INTRAVENOUS | Status: AC | PRN
  Administered 2023-10-14: 10 mL via INTRAVENOUS

## 2023-10-20 ENCOUNTER — Encounter (HOSPITAL_BASED_OUTPATIENT_CLINIC_OR_DEPARTMENT_OTHER): Payer: Self-pay | Admitting: *Deleted

## 2023-11-03 ENCOUNTER — Other Ambulatory Visit (HOSPITAL_BASED_OUTPATIENT_CLINIC_OR_DEPARTMENT_OTHER): Payer: Self-pay | Admitting: Family Medicine

## 2023-11-03 DIAGNOSIS — E7800 Pure hypercholesterolemia, unspecified: Secondary | ICD-10-CM

## 2023-11-03 DIAGNOSIS — I10 Essential (primary) hypertension: Secondary | ICD-10-CM

## 2023-11-03 MED ORDER — ATORVASTATIN CALCIUM 10 MG PO TABS: 10.0000 mg | ORAL_TABLET | Freq: Every day | ORAL | 1 refills | Status: AC

## 2023-11-03 MED ORDER — AMLODIPINE BESYLATE 10 MG PO TABS: 10.0000 mg | ORAL_TABLET | Freq: Every day | ORAL | 1 refills | Status: DC

## 2023-11-03 NOTE — Telephone Encounter (Signed)
 Copied from CRM [id number]. Topic: Clinical - Medication Refill >> Nov 03, 2023 12:43 PM Winona R wrote: Medication:amLODipine  (NORVASC ) 10 MG tablet  atorvastatin  (LIPITOR) 10 MG tablet   Has the patient contacted their pharmacy? No  This is the patient's preferred pharmacy:  [id number] [address], [place] - [address] AT [facility] [address] & [facility] [address] [place] [address] Phone: [phone] Fax: [phone]   Is this the correct pharmacy for this prescription? Yes If no, delete pharmacy and type the correct one.   Has the prescription been filled recently? No  Is the patient out of the medication? Yes  Has the patient been seen for an appointment in the last year OR does the patient have an upcoming appointment? Yes  Can we respond through MyChart? Yes  Agent: Please be advised that Rx refills may take up to 3 business days. We ask that you follow-up with your pharmacy.

## 2023-11-19 ENCOUNTER — Other Ambulatory Visit (HOSPITAL_BASED_OUTPATIENT_CLINIC_OR_DEPARTMENT_OTHER): Payer: Self-pay | Admitting: *Deleted

## 2023-11-19 DIAGNOSIS — Z0000 Encounter for general adult medical examination without abnormal findings: Secondary | ICD-10-CM

## 2023-11-20 LAB — CBC WITH DIFFERENTIAL/PLATELET
Basophils Absolute: 0 x10E3/uL (ref 0.0–0.2)
Basos: 0 %
EOS (ABSOLUTE): 0 x10E3/uL (ref 0.0–0.4)
Eos: 0 %
Hematocrit: 44.3 % (ref 37.5–51.0)
Hemoglobin: 14.2 g/dL (ref 13.0–17.7)
Immature Grans (Abs): 0.1 x10E3/uL (ref 0.0–0.1)
Immature Granulocytes: 1 %
Lymphocytes Absolute: 1.6 x10E3/uL (ref 0.7–3.1)
Lymphs: 30 %
MCH: 26.7 pg (ref 26.6–33.0)
MCHC: 32.1 g/dL (ref 31.5–35.7)
MCV: 83 fL (ref 79–97)
Monocytes Absolute: 0.6 x10E3/uL (ref 0.1–0.9)
Monocytes: 10 %
Neutrophils Absolute: 3 x10E3/uL (ref 1.4–7.0)
Neutrophils: 59 %
Platelets: 335 x10E3/uL (ref 150–450)
RBC: 5.31 x10E6/uL (ref 4.14–5.80)
RDW: 14.1 % (ref 11.6–15.4)
WBC: 5.3 x10E3/uL (ref 3.4–10.8)

## 2023-11-20 LAB — LIPID PANEL
Chol/HDL Ratio: 3.2 ratio (ref 0.0–5.0)
Cholesterol, Total: 174 mg/dL (ref 100–199)
HDL: 54 mg/dL (ref >39)
LDL Chol Calc (NIH): 99 mg/dL (ref 0–99)
Triglycerides: 118 mg/dL (ref 0–149)
VLDL Cholesterol Cal: 21 mg/dL (ref 5–40)

## 2023-11-20 LAB — COMPREHENSIVE METABOLIC PANEL WITH GFR
ALT: 34 IU/L (ref 0–44)
AST: 27 IU/L (ref 0–40)
Albumin: 4.6 g/dL (ref 3.8–4.9)
Alkaline Phosphatase: 106 IU/L (ref 47–123)
BUN/Creatinine Ratio: 14 (ref 9–20)
BUN: 15 mg/dL (ref 6–24)
Bilirubin Total: 0.4 mg/dL (ref 0.0–1.2)
CO2: 23 mmol/L (ref 20–29)
Calcium: 10.5 mg/dL — ABNORMAL HIGH (ref 8.7–10.2)
Chloride: 104 mmol/L (ref 96–106)
Creatinine, Ser: 1.09 mg/dL (ref 0.76–1.27)
Globulin, Total: 3 g/dL (ref 1.5–4.5)
Glucose: 100 mg/dL — ABNORMAL HIGH (ref 70–99)
Potassium: 4.5 mmol/L (ref 3.5–5.2)
Sodium: 141 mmol/L (ref 134–144)
Total Protein: 7.6 g/dL (ref 6.0–8.5)
eGFR: 78 mL/min/1.73 (ref >59)

## 2023-11-20 LAB — HEMOGLOBIN A1C
Est. average glucose Bld gHb Est-mCnc: 143 mg/dL
Hgb A1c MFr Bld: 6.6 % — ABNORMAL HIGH (ref 4.8–5.6)

## 2023-11-20 LAB — TSH RFX ON ABNORMAL TO FREE T4: TSH: 1.43 u[IU]/mL (ref 0.450–4.500)

## 2023-11-23 ENCOUNTER — Ambulatory Visit (INDEPENDENT_AMBULATORY_CARE_PROVIDER_SITE_OTHER): Payer: PRIVATE HEALTH INSURANCE | Admitting: Family Medicine

## 2023-11-23 ENCOUNTER — Encounter (HOSPITAL_BASED_OUTPATIENT_CLINIC_OR_DEPARTMENT_OTHER): Payer: Self-pay | Admitting: Family Medicine

## 2023-11-23 ENCOUNTER — Ambulatory Visit (INDEPENDENT_AMBULATORY_CARE_PROVIDER_SITE_OTHER): Payer: PRIVATE HEALTH INSURANCE

## 2023-11-23 VITALS — BP 129/85 | HR 66 | Ht 72.0 in | Wt 218.0 lb

## 2023-11-23 DIAGNOSIS — M25561 Pain in right knee: Secondary | ICD-10-CM | POA: Diagnosis not present

## 2023-11-23 DIAGNOSIS — G8929 Other chronic pain: Secondary | ICD-10-CM

## 2023-11-23 DIAGNOSIS — E119 Type 2 diabetes mellitus without complications: Secondary | ICD-10-CM

## 2023-11-23 DIAGNOSIS — Z0000 Encounter for general adult medical examination without abnormal findings: Secondary | ICD-10-CM | POA: Diagnosis not present

## 2023-11-23 DIAGNOSIS — M25562 Pain in left knee: Secondary | ICD-10-CM | POA: Diagnosis not present

## 2023-11-23 NOTE — Assessment & Plan Note (Addendum)
 New diagnosis for patient.  Long discussion today regarding background of condition, management goals, importance of maintaining appropriate control of blood sugars, potential complications and how blood sugar control plays a role in mitigating risk for these complications.  Also discussed risk of disability related to these complications or possible premature death. We reviewed management considerations including treatment options.  Treatment options range from conservative measures/lifestyle modifications which will be important to include with any treatment plan.  We also discussed medications which can be utilized including both oral and injectable options. Patient prefers to focus initially on lifestyle modifications which is reasonable. We will check urine microalbumin/creatinine ratio today for initial screening Foot exam completed today, documented in chart

## 2023-11-23 NOTE — Progress Notes (Signed)
 Subjective:    CC: Annual Physical Exam  HPI: Clifford Mejia is a 58 y.o. presenting for annual physical  I reviewed the past medical history, family history, social history, surgical history, and allergies today and no changes were needed.  Please see the problem list section below in epic for further details.  Past Medical History: Past Medical History:  Diagnosis Date   Hypertension    no meds prescribed   Past Surgical History: Past Surgical History:  Procedure Laterality Date   CHOLECYSTECTOMY     LEG SURGERY     laser   WRIST SURGERY Left    Social History: Social History   Socioeconomic History   Marital status: Single    Spouse name: Not on file   Number of children: Not on file   Years of education: Not on file   Highest education level: 12th grade  Occupational History   Not on file  Tobacco Use   Smoking status: Never    Passive exposure: Never   Smokeless tobacco: Never   Tobacco comments:    I do not use tobacco  Vaping Use   Vaping status: Never Used  Substance and Sexual Activity   Alcohol use: Yes    Comment: rarely   Drug use: No   Sexual activity: Yes    Birth control/protection: None    Comment: Fiance  Other Topics Concern   Not on file  Social History Narrative   Not on file   Social Drivers of Health   Financial Resource Strain: Low Risk  (05/24/2023)   Overall Financial Resource Strain (CARDIA)    Difficulty of Paying Living Expenses: Not hard at all  Food Insecurity: No Food Insecurity (05/24/2023)   Hunger Vital Sign    Worried About Running Out of Food in the Last Year: Never true    Ran Out of Food in the Last Year: Never true  Transportation Needs: No Transportation Needs (05/24/2023)   PRAPARE - Administrator, Civil Service (Medical): No    Lack of Transportation (Non-Medical): No  Physical Activity: Sufficiently Active (05/24/2023)   Exercise Vital Sign    Days of Exercise per Week: 6 days    Minutes of  Exercise per Session: 140 min  Stress: No Stress Concern Present (05/24/2023)   [employer] of Occupational Health - Occupational Stress Questionnaire    Feeling of Stress : Not at all  Social Connections: Moderately Isolated (05/24/2023)   Social Connection and Isolation Panel    Frequency of Communication with Friends and Family: Three times a week    Frequency of Social Gatherings with Friends and Family: Never    Attends Religious Services: More than 4 times per year    Active Member of [employer] or Organizations: No    Attends Engineer, Structural: Not on file    Marital Status: Separated   Family History: Family History  Problem Relation Age of Onset   Hypertension Mother    Hypertension Father    Allergies: No Known Allergies Medications: See med rec.  Review of Systems: No headache, visual changes, nausea, vomiting, diarrhea, constipation, dizziness, abdominal pain, skin rash, fevers, chills, night sweats, swollen lymph nodes, weight loss, chest pain, body aches, joint swelling, muscle aches, shortness of breath, mood changes, visual or auditory hallucinations.  Objective:    BP (!) 142/88 (BP Location: Right Arm, Patient Position: Sitting, Cuff Size: Large)   Pulse 66   Ht 6' (1.829 m)   Wt 218  lb (98.9 kg)   SpO2 96%   BMI 29.57 kg/m   General: Well Developed, well nourished, and in no acute distress. Neuro: Alert and oriented x3, extra-ocular muscles intact, sensation grossly intact. Cranial nerves II through XII are intact, motor, sensory, and coordinative functions are all intact. HEENT: Normocephalic, atraumatic, pupils equal round reactive to light, neck supple, no masses, no lymphadenopathy, thyroid nonpalpable. Oropharynx, nasopharynx, external ear canals are unremarkable. Skin: Warm and dry, no rashes noted. Cardiac: Regular rate and rhythm, no murmurs rubs or gallops. Respiratory: Clear to auscultation bilaterally. Not using accessory muscles,  speaking in full sentences. Abdominal: Soft, nontender, nondistended, positive bowel sounds, no masses, no organomegaly. Musculoskeletal: Shoulder, elbow, wrist, hip, knee, ankle stable, and with full range of motion.  Impression and Recommendations:    Wellness examination Assessment & Plan: Routine HCM labs reviewed. HCM reviewed/discussed. Anticipatory guidance regarding healthy weight, lifestyle and choices given. Recommend healthy diet.  Recommend approximately 150 minutes/week of moderate intensity exercise Recommend regular dental and vision exams Always use seatbelt/lap and shoulder restraints Recommend using smoke alarms and checking batteries at least twice a year Recommend using sunscreen when outside Discussed immunization recommendations   Type 2 diabetes mellitus without complication, without long-term current use of insulin (HCC) Assessment & Plan: New diagnosis for patient.  Long discussion today regarding background of condition, management goals, importance of maintaining appropriate control of blood sugars, potential complications and how blood sugar control plays a role in mitigating risk for these complications.  Also discussed risk of disability related to these complications or possible premature death. We reviewed management considerations including treatment options.  Treatment options range from conservative measures/lifestyle modifications which will be important to include with any treatment plan.  We also discussed medications which can be utilized including both oral and injectable options. Patient prefers to focus initially on lifestyle modifications which is reasonable. We will check urine microalbumin/creatinine ratio today for initial screening Foot exam completed today, documented in chart  Orders: -     Microalbumin / creatinine urine ratio  He will complete previously ordered knee x-rays today  Return in about 3 months (around 02/23/2024) for  diabetes.   ___________________________________________ Horacio Werth de [place], MD, ABFM, CAQSM Primary Care and Sports Medicine [facility]

## 2023-11-23 NOTE — Patient Instructions (Signed)
  Medication Instructions:  Your physician recommends that you continue on your current medications as directed. Please refer to the Current Medication list given to you today. --If you need a refill on any your medications before your next appointment, please call your pharmacy first. If no refills are authorized on file call the office.-- Lab Work: Your physician has recommended that you have lab work today: yes (urine microalbumin) If you have labs (blood work) drawn today and your tests are completely normal, you will receive your results via MyChart message OR a phone call from our staff.  Please ensure you check your voicemail in the event that you authorized detailed messages to be left on a delegated number. If you have any lab test that is abnormal or we need to change your treatment, we will call you to review the results.  Referrals/Procedures/Imaging: [place] calender  Follow-Up: Your next appointment:   Your physician recommends that you schedule a follow-up appointment in: 3 months with Dr. de [place]  You will receive a text message or e-mail with a link to a survey about your care and experience with us  today! We would greatly appreciate your feedback!   Thanks for letting us  be apart of your health journey!!  Primary Care and Sports Medicine   Dr. Quintin sheerer [place]   We encourage you to activate your patient portal called MyChart.  Sign up information is provided on this After Visit Summary.  MyChart is used to connect with patients for Virtual Visits (Telemedicine).  Patients are able to view lab/test results, encounter notes, upcoming appointments, etc.  Non-urgent messages can be sent to your provider as well. To learn more about what you can do with MyChart, please visit --  [email or web address].

## 2023-11-23 NOTE — Assessment & Plan Note (Signed)

## 2023-11-24 ENCOUNTER — Ambulatory Visit (HOSPITAL_BASED_OUTPATIENT_CLINIC_OR_DEPARTMENT_OTHER): Payer: Self-pay | Admitting: Family Medicine

## 2023-11-24 LAB — MICROALBUMIN / CREATININE URINE RATIO
Creatinine, Urine: 163.7 mg/dL
Microalb/Creat Ratio: 9 mg/g{creat} (ref 0–29)
Microalbumin, Urine: 15.5 ug/mL

## 2023-12-06 ENCOUNTER — Ambulatory Visit (HOSPITAL_BASED_OUTPATIENT_CLINIC_OR_DEPARTMENT_OTHER): Payer: Self-pay | Admitting: Family Medicine

## 2024-01-31 ENCOUNTER — Other Ambulatory Visit: Payer: Self-pay | Admitting: *Deleted

## 2024-01-31 DIAGNOSIS — I10 Essential (primary) hypertension: Secondary | ICD-10-CM

## 2024-01-31 MED ORDER — AMLODIPINE BESYLATE 10 MG PO TABS: 10.0000 mg | ORAL_TABLET | Freq: Every day | ORAL | 1 refills | Status: AC

## 2024-02-25 ENCOUNTER — Encounter (HOSPITAL_BASED_OUTPATIENT_CLINIC_OR_DEPARTMENT_OTHER): Payer: Self-pay | Admitting: Family Medicine

## 2024-02-25 ENCOUNTER — Ambulatory Visit (HOSPITAL_BASED_OUTPATIENT_CLINIC_OR_DEPARTMENT_OTHER): Payer: PRIVATE HEALTH INSURANCE | Admitting: Family Medicine

## 2024-02-25 VITALS — BP 135/91 | HR 68 | Resp 18 | Ht 72.0 in | Wt 221.0 lb

## 2024-02-25 DIAGNOSIS — G8929 Other chronic pain: Secondary | ICD-10-CM

## 2024-02-25 DIAGNOSIS — N529 Male erectile dysfunction, unspecified: Secondary | ICD-10-CM | POA: Insufficient documentation

## 2024-02-25 DIAGNOSIS — I10 Essential (primary) hypertension: Secondary | ICD-10-CM

## 2024-02-25 DIAGNOSIS — E119 Type 2 diabetes mellitus without complications: Secondary | ICD-10-CM

## 2024-02-25 LAB — POCT GLYCOSYLATED HEMOGLOBIN (HGB A1C): Hemoglobin A1C: 6 % — AB (ref 4.0–5.6)

## 2024-02-25 NOTE — Progress Notes (Unsigned)
" ° ° °  Procedures performed today:    None.  Independent interpretation of notes and tests performed by another provider:   None.  Brief History, Exam, Impression, and Recommendations:    BP (!) 135/91 (BP Location: Left Arm, Patient Position: Sitting, Cuff Size: Normal)   Pulse 68   Resp 18   Ht 6' (1.829 m)   Wt 221 lb (100.2 kg)   SpO2 100%   BMI 29.97 kg/m   Type 2 diabetes mellitus without complication, without long-term current use of insulin (HCC)  Primary hypertension  Chronic pain of both knees  No follow-ups on file.   ___________________________________________ Danise Dehne de [place], MD, ABFM, CAQSM Primary Care and Sports Medicine [facility] "

## 2024-05-25 ENCOUNTER — Ambulatory Visit (HOSPITAL_BASED_OUTPATIENT_CLINIC_OR_DEPARTMENT_OTHER): Payer: PRIVATE HEALTH INSURANCE | Admitting: Family Medicine
